# Patient Record
Sex: Female | Born: 1935 | Race: White | Hispanic: No | Marital: Single | Smoking: Current every day smoker
Health system: Southern US, Community
[De-identification: ages and names within clinical notes are randomized; demographics above are authoritative.]

## PROBLEM LIST (undated history)

## (undated) DIAGNOSIS — F329 Major depressive disorder, single episode, unspecified: Secondary | ICD-10-CM

## (undated) DIAGNOSIS — F319 Bipolar disorder, unspecified: Secondary | ICD-10-CM

## (undated) DIAGNOSIS — C801 Malignant (primary) neoplasm, unspecified: Secondary | ICD-10-CM

## (undated) DIAGNOSIS — F32A Depression, unspecified: Secondary | ICD-10-CM

## (undated) HISTORY — PX: MASTECTOMY: SHX3

## (undated) HISTORY — PX: JOINT REPLACEMENT: SHX530

---

## 2014-03-26 ENCOUNTER — Encounter (HOSPITAL_COMMUNITY): Payer: Self-pay | Admitting: Emergency Medicine

## 2014-03-26 ENCOUNTER — Observation Stay (HOSPITAL_COMMUNITY)
Admission: EM | Admit: 2014-03-26 | Discharge: 2014-03-29 | Disposition: A | Discharge status: Home / Self Care | Payer: Medicare Other | Attending: Family Medicine | Admitting: Family Medicine

## 2014-03-26 ENCOUNTER — Emergency Department (HOSPITAL_COMMUNITY): Payer: Medicare Other

## 2014-03-26 DIAGNOSIS — F32A Depression, unspecified: Secondary | ICD-10-CM

## 2014-03-26 DIAGNOSIS — F1010 Alcohol abuse, uncomplicated: Secondary | ICD-10-CM | POA: Insufficient documentation

## 2014-03-26 DIAGNOSIS — Z79899 Other long term (current) drug therapy: Secondary | ICD-10-CM | POA: Diagnosis not present

## 2014-03-26 DIAGNOSIS — F333 Major depressive disorder, recurrent, severe with psychotic symptoms: Secondary | ICD-10-CM | POA: Insufficient documentation

## 2014-03-26 DIAGNOSIS — F0390 Unspecified dementia without behavioral disturbance: Secondary | ICD-10-CM | POA: Insufficient documentation

## 2014-03-26 DIAGNOSIS — F03918 Unspecified dementia, unspecified severity, with other behavioral disturbance: Secondary | ICD-10-CM

## 2014-03-26 DIAGNOSIS — F0391 Unspecified dementia with behavioral disturbance: Secondary | ICD-10-CM

## 2014-03-26 DIAGNOSIS — Z66 Do not resuscitate: Secondary | ICD-10-CM | POA: Diagnosis not present

## 2014-03-26 DIAGNOSIS — F329 Major depressive disorder, single episode, unspecified: Secondary | ICD-10-CM

## 2014-03-26 DIAGNOSIS — Z96659 Presence of unspecified artificial knee joint: Secondary | ICD-10-CM | POA: Insufficient documentation

## 2014-03-26 DIAGNOSIS — R4182 Altered mental status, unspecified: Principal | ICD-10-CM | POA: Insufficient documentation

## 2014-03-26 DIAGNOSIS — Z853 Personal history of malignant neoplasm of breast: Secondary | ICD-10-CM | POA: Insufficient documentation

## 2014-03-26 DIAGNOSIS — F17200 Nicotine dependence, unspecified, uncomplicated: Secondary | ICD-10-CM | POA: Insufficient documentation

## 2014-03-26 DIAGNOSIS — F3289 Other specified depressive episodes: Secondary | ICD-10-CM

## 2014-03-26 HISTORY — DX: Depression, unspecified: F32.A

## 2014-03-26 HISTORY — DX: Malignant (primary) neoplasm, unspecified: C80.1

## 2014-03-26 HISTORY — DX: Bipolar disorder, unspecified: F31.9

## 2014-03-26 HISTORY — DX: Major depressive disorder, single episode, unspecified: F32.9

## 2014-03-26 LAB — I-STAT CHEM 8, ED
BUN: 14 mg/dL (ref 6–23)
CHLORIDE: 101 meq/L (ref 96–112)
Calcium, Ion: 1.17 mmol/L (ref 1.13–1.30)
Creatinine, Ser: 0.8 mg/dL (ref 0.50–1.10)
Glucose, Bld: 121 mg/dL — ABNORMAL HIGH (ref 70–99)
HEMATOCRIT: 52 % — AB (ref 36.0–46.0)
Hemoglobin: 17.7 g/dL — ABNORMAL HIGH (ref 12.0–15.0)
Potassium: 3.4 mEq/L — ABNORMAL LOW (ref 3.7–5.3)
SODIUM: 142 meq/L (ref 137–147)
TCO2: 24 mmol/L (ref 0–100)

## 2014-03-26 LAB — RAPID URINE DRUG SCREEN, HOSP PERFORMED
Amphetamines: NOT DETECTED
Barbiturates: NOT DETECTED
Benzodiazepines: NOT DETECTED
COCAINE: NOT DETECTED
OPIATES: NOT DETECTED
Tetrahydrocannabinol: NOT DETECTED

## 2014-03-26 LAB — URINALYSIS, ROUTINE W REFLEX MICROSCOPIC
Bilirubin Urine: NEGATIVE
Glucose, UA: NEGATIVE mg/dL
Ketones, ur: NEGATIVE mg/dL
Leukocytes, UA: NEGATIVE
NITRITE: NEGATIVE
PH: 7.5 (ref 5.0–8.0)
Protein, ur: NEGATIVE mg/dL
SPECIFIC GRAVITY, URINE: 1.013 (ref 1.005–1.030)
UROBILINOGEN UA: 0.2 mg/dL (ref 0.0–1.0)

## 2014-03-26 LAB — CBC WITH DIFFERENTIAL/PLATELET
BASOS ABS: 0 10*3/uL (ref 0.0–0.1)
Basophils Relative: 0 % (ref 0–1)
Eosinophils Absolute: 0 10*3/uL (ref 0.0–0.7)
Eosinophils Relative: 0 % (ref 0–5)
HCT: 47.4 % — ABNORMAL HIGH (ref 36.0–46.0)
HEMOGLOBIN: 16.2 g/dL — AB (ref 12.0–15.0)
Lymphocytes Relative: 12 % (ref 12–46)
Lymphs Abs: 1.2 10*3/uL (ref 0.7–4.0)
MCH: 33.4 pg (ref 26.0–34.0)
MCHC: 34.2 g/dL (ref 30.0–36.0)
MCV: 97.7 fL (ref 78.0–100.0)
Monocytes Absolute: 0.8 10*3/uL (ref 0.1–1.0)
Monocytes Relative: 8 % (ref 3–12)
NEUTROS ABS: 8.5 10*3/uL — AB (ref 1.7–7.7)
NEUTROS PCT: 80 % — AB (ref 43–77)
PLATELETS: 276 10*3/uL (ref 150–400)
RBC: 4.85 MIL/uL (ref 3.87–5.11)
RDW: 15.1 % (ref 11.5–15.5)
WBC: 10.5 10*3/uL (ref 4.0–10.5)

## 2014-03-26 LAB — URINE MICROSCOPIC-ADD ON

## 2014-03-26 LAB — CREATININE, SERUM
Creatinine, Ser: 0.64 mg/dL (ref 0.50–1.10)
GFR calc Af Amer: >90 mL/min (ref >90)
GFR, EST NON AFRICAN AMERICAN: 85 mL/min — AB (ref >90)

## 2014-03-26 LAB — TSH: TSH: 2.52 u[IU]/mL (ref 0.350–4.500)

## 2014-03-26 LAB — AMMONIA: AMMONIA: 30 umol/L (ref 11–60)

## 2014-03-26 LAB — ETHANOL: Alcohol, Ethyl (B): <11 mg/dL (ref 0–11)

## 2014-03-26 LAB — VITAMIN B12: Vitamin B-12: 325 pg/mL (ref 211–911)

## 2014-03-26 MED ORDER — SODIUM CHLORIDE 0.9 % IV BOLUS (SEPSIS)
1000.0000 mL | Freq: Once | INTRAVENOUS | Status: AC
  Administered 2014-03-26: 1000 mL via INTRAVENOUS

## 2014-03-26 MED ORDER — BUPROPION HCL ER (XL) 150 MG PO TB24
150.0000 mg | ORAL_TABLET | Freq: Every day | ORAL | Status: DC
  Administered 2014-03-26 – 2014-03-27 (×2): 150 mg via ORAL
  Filled 2014-03-26 (×2): qty 1

## 2014-03-26 MED ORDER — ONDANSETRON HCL 4 MG/2ML IJ SOLN: 4.0000 mg | Freq: Four times a day (QID) | INTRAMUSCULAR | Status: DC | PRN

## 2014-03-26 MED ORDER — ACETAMINOPHEN 325 MG PO TABS: 650.0000 mg | ORAL_TABLET | Freq: Four times a day (QID) | ORAL | Status: DC | PRN

## 2014-03-26 MED ORDER — ENOXAPARIN SODIUM 40 MG/0.4ML ~~LOC~~ SOLN
40.0000 mg | SUBCUTANEOUS | Status: DC
  Administered 2014-03-26 – 2014-03-29 (×4): 40 mg via SUBCUTANEOUS
  Filled 2014-03-26 (×4): qty 0.4

## 2014-03-26 MED ORDER — THIAMINE HCL 100 MG/ML IJ SOLN
Freq: Once | INTRAVENOUS | Status: AC
  Administered 2014-03-26: 09:00:00 via INTRAVENOUS
  Filled 2014-03-26: qty 1000

## 2014-03-26 MED ORDER — THIAMINE HCL 100 MG/ML IJ SOLN
Freq: Once | INTRAVENOUS | Status: AC
  Administered 2014-03-26: 10:00:00 via INTRAVENOUS
  Filled 2014-03-26: qty 1000

## 2014-03-26 MED ORDER — DONEPEZIL HCL 5 MG PO TABS
5.0000 mg | ORAL_TABLET | Freq: Every day | ORAL | Status: DC
  Administered 2014-03-26: 5 mg via ORAL
  Filled 2014-03-26 (×2): qty 1

## 2014-03-26 MED ORDER — CIPROFLOXACIN IN D5W 400 MG/200ML IV SOLN
400.0000 mg | Freq: Two times a day (BID) | INTRAVENOUS | Status: DC
  Administered 2014-03-26 – 2014-03-28 (×4): 400 mg via INTRAVENOUS
  Filled 2014-03-26 (×5): qty 200

## 2014-03-26 MED ORDER — LAMOTRIGINE 25 MG PO TABS
25.0000 mg | ORAL_TABLET | Freq: Every day | ORAL | Status: DC
  Administered 2014-03-26 – 2014-03-27 (×2): 25 mg via ORAL
  Filled 2014-03-26 (×2): qty 1

## 2014-03-26 MED ORDER — VENLAFAXINE HCL ER 150 MG PO CP24
150.0000 mg | ORAL_CAPSULE | Freq: Every day | ORAL | Status: DC
  Administered 2014-03-27: 150 mg via ORAL
  Filled 2014-03-26 (×2): qty 1

## 2014-03-26 MED ORDER — ONDANSETRON HCL 4 MG PO TABS: 4.0000 mg | ORAL_TABLET | Freq: Four times a day (QID) | ORAL | Status: DC | PRN

## 2014-03-26 MED ORDER — ACETAMINOPHEN 650 MG RE SUPP: 650.0000 mg | Freq: Four times a day (QID) | RECTAL | Status: DC | PRN

## 2014-03-26 NOTE — ED Provider Notes (Signed)
CSN: [id number]     Arrival date & time 03/26/14  0031 History   First MD Initiated Contact with Patient 03/26/14 0051     Chief Complaint  Patient presents with  . Altered Mental Status     (Consider location/radiation/quality/duration/timing/severity/associated sxs/prior Treatment) HPI Comments: EMS was called to the [facility].  Due to the patient being in the lobby confused, unable to give a concise medical history name, address why she was at a hotel.  In her possession.  She does have her pocketbook with 2 different ideas for her to address this one in [place], [place], and 1, and, states he'll in her pocketbook.  She has several checkbooks 14 her periods and states no 1, for herself in [place].  When asked specifically about her parents.  She states that her father is now in a nursing home, and the mother is in an assisted care facility, and she is here taking care of their affairs.  She does, state that she travels back-and-forth between [place] and states, while, but she is unclear as to why.  She was at the [facility].  She states, that she has a brother, and sister-in-law, who recently moved to [place], and she was attempting to get to their home, but became lost, and stopped at the [place] to try to find or establish her bearings  Patient is a 79 y.o. female presenting with altered mental status. The history is provided by the patient and the EMS personnel. The history is limited by the condition of the patient.  Altered Mental Status Presenting symptoms: confusion and memory loss   Severity:  Severe Episode history:  Unable to specify Timing:  Unable to specify Progression:  Unable to specify Associated symptoms: no fever, no headaches and no nausea     Past Medical History  Diagnosis Date  . lt breast ca dx'd 2007 or 2008 per pt    chemo and xrt  . Depression   . Bipolar disorder    Past Surgical History  Procedure Laterality Date  . Joint replacement       left total knee  . Mastectomy      Left   History reviewed. No pertinent family history. History  Substance Use Topics  . Smoking status: Current Every Day Smoker -- 0.50 packs/day    Types: Cigarettes  . Smokeless tobacco: Never Used  . Alcohol Use: 3.5 oz/week    7 drink(s) per week   OB History   Grav Para Term Preterm Abortions TAB SAB Ect Mult Living                 Review of Systems  Constitutional: Negative for fever.  HENT: Negative for congestion.   Respiratory: Negative for shortness of breath.   Cardiovascular: Negative for chest pain and leg swelling.  Gastrointestinal: Negative for nausea and diarrhea.  Genitourinary: Negative for dysuria.  Neurological: Negative for dizziness and headaches.  Psychiatric/Behavioral: Positive for memory loss and confusion.  All other systems reviewed and are negative.     Allergies  Penicillins  Home Medications   Prior to Admission medications   Medication Sig Start Date End Date Taking? Authorizing Provider  buPROPion (WELLBUTRIN XL) 150 MG 24 hr tablet Take 300 mg by mouth every morning.    Yes Historical Provider, MD  donepezil (ARICEPT) 5 MG tablet Take 5 mg by mouth at bedtime.   Yes Historical Provider, MD  lamoTRIgine (LAMICTAL) 25 MG tablet Take 25 mg by mouth 2 (two)  times daily.    Yes Historical Provider, MD  simvastatin (ZOCOR) 40 MG tablet Take 40 mg by mouth every evening.   Yes Historical Provider, MD  venlafaxine XR (EFFEXOR-XR) 150 MG 24 hr capsule Take 150 mg by mouth daily with breakfast.   Yes Historical Provider, MD   BP 143/80  Pulse 87  Temp(Src) 97.8 F (36.6 C) (Oral)  Resp 18  Ht 5\' 6"  (1.676 m)  Wt 167 lb 12.3 oz (76.1 kg)  BMI 27.09 kg/m2  SpO2 93% Physical Exam  Nursing note and vitals reviewed. Constitutional: She appears well-developed and well-nourished.  HENT:  Head: Normocephalic.  Eyes: Pupils are equal, round, and reactive to light.  Neck: Normal range of motion.   Cardiovascular: Normal rate and regular rhythm.   Pulmonary/Chest: Effort normal and breath sounds normal.  Abdominal: Soft. She exhibits distension. There is no tenderness.  Musculoskeletal: Normal range of motion. She exhibits no tenderness.  Neurological: She is alert. No cranial nerve deficit or sensory deficit.  Psychiatric: Her speech is normal and behavior is normal. Cognition and memory are impaired.  confused    ED Course  Procedures (including critical care time) Labs Review Labs Reviewed  CBC WITH DIFFERENTIAL - Abnormal; Notable for the following:    Hemoglobin 16.2 (*)    HCT 47.4 (*)    Neutrophils Relative % 80 (*)    Neutro Abs 8.5 (*)    All other components within normal limits  URINALYSIS, ROUTINE W REFLEX MICROSCOPIC - Abnormal; Notable for the following:    Hgb urine dipstick SMALL (*)    All other components within normal limits  URINE MICROSCOPIC-ADD ON - Abnormal; Notable for the following:    Squamous Epithelial / LPF FEW (*)    All other components within normal limits  CREATININE, SERUM - Abnormal; Notable for the following:    GFR calc non Af Amer 85 (*)    All other components within normal limits  I-STAT CHEM 8, ED - Abnormal; Notable for the following:    Potassium 3.4 (*)    Glucose, Bld 121 (*)    Hemoglobin 17.7 (*)    HCT 52.0 (*)    All other components within normal limits  URINE CULTURE  ETHANOL  URINE RAPID DRUG SCREEN (HOSP PERFORMED)  AMMONIA  VITAMIN B12  VITAMIN B1  TSH  CBC  COMPREHENSIVE METABOLIC PANEL    Imaging Review Dg Chest 2 View  03/26/2014   CLINICAL DATA:  Altered mental status.  History of breast cancer.  EXAM: CHEST  2 VIEW  COMPARISON:  None.  FINDINGS: Shallow inspiration. Heart size and pulmonary vascularity are normal for technique. No focal airspace disease in the lungs. No blunting of costophrenic angles. Degenerative changes in the spine. Surgical clips in the left axilla. Old fracture deformity of distal  left clavicle.  IMPRESSION: Shallow inspiration.  No evidence of active pulmonary disease.   Electronically Signed   By: Lucienne Capers M.D.   On: 03/26/2014 05:06   Ct Head Wo Contrast  03/26/2014   CLINICAL DATA:  Altered mental status, memory difficulties. History of breast cancer.  EXAM: CT HEAD WITHOUT CONTRAST  TECHNIQUE: Contiguous axial images were obtained from the base of the skull through the vertex without intravenous contrast.  COMPARISON:  MRI of the brain October 25, 2013  FINDINGS: Moderate to severe ventriculomegaly, likely on the basis of global parenchymal brain volume loss as there is commensurate enlargement of cerebral sulci and cerebellar folia, which appears  somewhat advanced from prior imaging. Bifrontal diameter was 4.5 cm, canal 4.7 cm.  No intraparenchymal hemorrhage, mass effect nor midline shift. Patchy supratentorial white matter hypodensities are within normal range for patient's age and though non-specific suggest sequelae of chronic small vessel ischemic disease. No acute large vascular territory infarcts. Remote right basal ganglia lacunar infarct.  No abnormal extra-axial fluid collections. Basal cisterns are patent. Mild calcific atherosclerosis of the carotid siphons.  No skull fracture. Osteopenia. The included ocular globes and orbital contents are non-suspicious. The mastoid aircells and included paranasal sinuses are well-aerated. Thickened maxillary sinus walls compatible with chronic sinusitis without acute component.  IMPRESSION: Moderate to severe apparent global parenchymal brain volume loss, considering progression from prior MRI could reflect a component of treatment related changes are dehydration, recommend clinical correlation.  Moderate white matter changes suggest chronic small vessel ischemic disease. Remote right basal ganglia lacunar infarct.  Osteopenia.   Electronically Signed   By: Elon Alas   On: 03/26/2014 02:14     EKG  Interpretation None      MDM  Tried to contact family with numbers  In phone and pocketbook  Final diagnoses:  Altered mental status, unspecified altered mental status type         Garald Balding, NP 03/26/14 2025

## 2014-03-26 NOTE — ED Notes (Signed)
In and out attempted by two NTs, no urine return. Gave pt water, will re-attempt.

## 2014-03-26 NOTE — Consult Note (Signed)
Jordan Floyd Face-to-Face Psychiatry Consult   Reason for Consult:  AMS and history of depression Referring Physician:  Dr. Lelon Mast Krisher is an 79 y.o. female. Total Time spent with patient: 45 minutes  Assessment: AXIS I:  Major Depression, Recurrent severe AXIS II:  Deferred AXIS III:   Past Medical History  Diagnosis Date  . lt breast ca dx'd 2007 or 2008 per pt    chemo and xrt  . Depression   . Bipolar disorder    AXIS IV:  other psychosocial or environmental problems, problems related to social environment and problems with primary support group AXIS V:  31-40 impairment in reality testing  Plan: Patient has capacity to make her own medical decisions based on evaluation today Case discussed with Dr. Wendee Beavers  Increase Wellbutrin XL 300 mg Qam for depression Discontinue Effexor - not helpful Increase Aricept 10 mg PO Qhs Appreciate dementia work up No evidence of imminent risk to self or others at present.   Patient does not meet criteria for psychiatric inpatient admission. Supportive therapy provided about ongoing stressors. Discussed crisis plan, support from social network, calling 911, coming to the Emergency Department, and calling Suicide Hotline. Appreciate psychiatric consultation Please contact [phone] if needs further assistance  Subjective:   Jordan Floyd is a 79 y.o. female patient admitted with AMS.  HPI: Patient is seen, chart reviewed and case discussed with Sindy Messing, LCSW and Dr. Wendee Beavers. Patient stated that she has been living in [place], [place], came to [place] several months ago to stay near her mother who lives in [place] and her brother lives in [place]. 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 She reportedly visiting her brother and than lost her car in [place] parking lot and  called police to locate her car. She couold not explain how she end up parking her care at hotel. She reportedly struggling with memory loss and forgetful and lost herself while going to different places and asking for directions. Patient is pleasantly confused at this time, says that her mother and brother live in town, and she was visiting them.  Patient lives by herself, she is no family, her brother and mother died many years ago. Patient has history of wandering, and 6 weeks ago she wondered to [employer]. Patient has been seen by psychiatrist Dr. Elon Jester in [place], [place] for depression. She is also on medication Aricept for dementia. Patient has answers most of the questions without much difficult and wishes to be with her mother who was 45 years and does not want to be in assisted living at this time. She stated that she has property in [place], [place] and wants to contact agent and friends to sell the property because she is planning to spend rest of her life in [place], [place]    HPI Elements:  Location:  dementia and depression. Quality:  fair. Severity:  moderate. Timing:  lost orientation from time to time.  Past Psychiatric History: Past Medical History  Diagnosis Date  . lt breast ca dx'd 2007 or 2008 per pt    chemo and xrt  . Depression   . Bipolar disorder     reports that she has been smoking Cigarettes.  She has been smoking about 0.50 packs per day. She has never used smokeless tobacco. She reports that she drinks about 3.5 ounces  of alcohol per week. She reports that she does not use illicit drugs. History reviewed. No pertinent family history.   Living Arrangements: Alone   Abuse/Neglect [facility]) Physical Abuse: Denies Verbal Abuse: Denies Sexual Abuse: Denies Allergies:   Allergies  Allergen Reactions  . Penicillins Other (See Comments)    Childhood allergy    ACT Assessment Complete:  NO Objective: Blood pressure 162/93, pulse 77, temperature 97.5 F (36.4 C),  temperature source Oral, resp. rate 16, height _0  (1.676 m), weight 76.1 kg (167 lb 12.3 oz), SpO2 93.00%.Body mass index is 27.09 kg/(m^2). Results for orders placed during the hospital encounter of 03/26/14 (from the past 72 hour(s))  CBC WITH DIFFERENTIAL     Status: Abnormal   Collection Time    03/26/14  1:41 AM      Result Value Ref Range   WBC 10.5  4.0 - 10.5 K/uL   RBC 4.85  3.87 - 5.11 MIL/uL   Hemoglobin 16.2 (*) 12.0 - 15.0 g/dL   HCT 47.4 (*) 36.0 - 46.0 %   MCV 97.7  78.0 - 100.0 fL   MCH 33.4  26.0 - 34.0 pg   MCHC 34.2  30.0 - 36.0 g/dL   RDW 15.1  11.5 - 15.5 %   Platelets 276  150 - 400 K/uL   Neutrophils Relative % 80 (*) 43 - 77 %   Neutro Abs 8.5 (*) 1.7 - 7.7 K/uL   Lymphocytes Relative 12  12 - 46 %   Lymphs Abs 1.2  0.7 - 4.0 K/uL   Monocytes Relative 8  3 - 12 %   Monocytes Absolute 0.8  0.1 - 1.0 K/uL   Eosinophils Relative 0  0 - 5 %   Eosinophils Absolute 0.0  0.0 - 0.7 K/uL   Basophils Relative 0  0 - 1 %   Basophils Absolute 0.0  0.0 - 0.1 K/uL  ETHANOL     Status: None   Collection Time    03/26/14  1:41 AM      Result Value Ref Range   Alcohol, Ethyl (B) <11  0 - 11 mg/dL   Comment:            LOWEST DETECTABLE LIMIT FOR     SERUM ALCOHOL IS 11 mg/dL     FOR MEDICAL PURPOSES ONLY  I-STAT CHEM 8, ED     Status: Abnormal   Collection Time    03/26/14  1:52 AM      Result Value Ref Range   Sodium 142  137 - 147 mEq/L   Potassium 3.4 (*) 3.7 - 5.3 mEq/L   Chloride 101  96 - 112 mEq/L   BUN 14  6 - 23 mg/dL   Creatinine, Ser 0.80  0.50 - 1.10 mg/dL   Glucose, Bld 121 (*) 70 - 99 mg/dL   Calcium, Ion 1.17  1.13 - 1.30 mmol/L   TCO2 24  0 - 100 mmol/L   Hemoglobin 17.7 (*) 12.0 - 15.0 g/dL   HCT 52.0 (*) 36.0 - 46.0 %  URINALYSIS, ROUTINE W REFLEX MICROSCOPIC     Status: Abnormal   Collection Time    03/26/14  5:42 AM      Result Value Ref Range   Color, Urine YELLOW  YELLOW   APPearance CLEAR  CLEAR   Specific Gravity, Urine 1.013   1.005 - 1.030   pH 7.5  5.0 - 8.0   Glucose, UA NEGATIVE  NEGATIVE mg/dL   Hgb  urine dipstick SMALL (*) NEGATIVE   Bilirubin Urine NEGATIVE  NEGATIVE   Ketones, ur NEGATIVE  NEGATIVE mg/dL   Protein, ur NEGATIVE  NEGATIVE mg/dL   Urobilinogen, UA 0.2  0.0 - 1.0 mg/dL   Nitrite NEGATIVE  NEGATIVE   Leukocytes, UA NEGATIVE  NEGATIVE  URINE RAPID DRUG SCREEN (HOSP PERFORMED)     Status: None   Collection Time    03/26/14  5:42 AM      Result Value Ref Range   Opiates NONE DETECTED  NONE DETECTED   Cocaine NONE DETECTED  NONE DETECTED   Benzodiazepines NONE DETECTED  NONE DETECTED   Amphetamines NONE DETECTED  NONE DETECTED   Tetrahydrocannabinol NONE DETECTED  NONE DETECTED   Barbiturates NONE DETECTED  NONE DETECTED   Comment:            DRUG SCREEN FOR MEDICAL PURPOSES     ONLY.  IF CONFIRMATION IS NEEDED     FOR ANY PURPOSE, NOTIFY LAB     WITHIN 5 DAYS.                LOWEST DETECTABLE LIMITS     FOR URINE DRUG SCREEN     Drug Class       Cutoff (ng/mL)     Amphetamine      1000     Barbiturate      200     Benzodiazepine   364     Tricyclics       680     Opiates          300     Cocaine          300     THC              50  URINE MICROSCOPIC-ADD ON     Status: Abnormal   Collection Time    03/26/14  5:42 AM      Result Value Ref Range   Squamous Epithelial / LPF FEW (*) RARE   WBC, UA 0-2  <3 WBC/hpf   RBC / HPF 3-6  <3 RBC/hpf   Bacteria, UA RARE  RARE   Urine-Other AMORPHOUS URATES/PHOSPHATES    AMMONIA     Status: None   Collection Time    03/26/14  7:10 AM      Result Value Ref Range   Ammonia 30  11 - 60 umol/L  CREATININE, SERUM     Status: Abnormal   Collection Time    03/26/14  7:10 AM      Result Value Ref Range   Creatinine, Ser 0.64  0.50 - 1.10 mg/dL   GFR calc non Af Amer 85 (*) >90 mL/min   GFR calc Af Amer >90  >90 mL/min   Comment: (NOTE)     The eGFR has been calculated using the CKD EPI equation.     This calculation has not been  validated in all clinical situations.     eGFR's persistently <90 mL/min signify possible Chronic Kidney     Disease.   Labs are reviewed and are pertinent for WNL.  Current Facility-Administered Medications  Medication Dose Route Frequency Provider Last Rate Last Dose  . acetaminophen (TYLENOL) tablet 650 mg  650 mg Oral Q6H PRN Oswald Hillock, MD       Or  . acetaminophen (TYLENOL) suppository 650 mg  650 mg Rectal Q6H PRN Oswald Hillock, MD      . buPROPion (WELLBUTRIN XL) 24  hr tablet 150 mg  150 mg Oral Daily Oswald Hillock, MD      . donepezil (ARICEPT) tablet 5 mg  5 mg Oral QHS Oswald Hillock, MD      . enoxaparin (LOVENOX) injection 40 mg  40 mg Subcutaneous Q24H Oswald Hillock, MD      . lamoTRIgine (LAMICTAL) tablet 25 mg  25 mg Oral Daily Oswald Hillock, MD      . ondansetron (ZOFRAN) tablet 4 mg  4 mg Oral Q6H PRN Oswald Hillock, MD       Or  . ondansetron (ZOFRAN) injection 4 mg  4 mg Intravenous Q6H PRN Oswald Hillock, MD      . sodium chloride 0.9 % 1,000 mL with thiamine 623 mg, folic acid 1 mg, multivitamins adult 10 mL infusion   Intravenous Once Oswald Hillock, MD      . Derrill Memo ON 03/27/2014] venlafaxine XR (EFFEXOR-XR) 24 hr capsule 150 mg  150 mg Oral Q breakfast Oswald Hillock, MD        Psychiatric Specialty Exam: Physical Exam  ROS  Blood pressure 162/93, pulse 77, temperature 97.5 F (36.4 C), temperature source Oral, resp. rate 16, height _0  (1.676 m), weight 76.1 kg (167 lb 12.3 oz), SpO2 93.00%.Body mass index is 27.09 kg/(m^2).  General Appearance: Casual  Eye Contact::  Good  Speech:  Clear and Coherent  Volume:  Normal  Mood:  Anxious and Depressed  Affect:  Constricted and Depressed  Thought Process:  Coherent and Goal Directed  Orientation:  Full (Time, Place, and Person)  Thought Content:  WDL  Suicidal Thoughts:  No  Homicidal Thoughts:  No  Memory:  Immediate;   Good  Judgement:  Intact  Insight:  Fair  Psychomotor Activity:  Decreased  Concentration:   Fair  Recall:  Good  Fund of Knowledge:Good  Language: Good  Akathisia:  NA  Handed:  Right  AIMS (if indicated):     Assets:  Communication Skills Desire for Improvement Financial Resources/Insurance Housing Leisure Time [place] Talents/Skills Transportation  Sleep:      Musculoskeletal: Strength & Muscle Tone: within normal limits Gait & Station: unable to stand Patient leans: N/A  Treatment Plan Summary: Daily contact with patient to assess and evaluate symptoms and progress in treatment Medication management  JONNALAGADDA,JANARDHAHA R. 03/26/2014 1:57 PM

## 2014-03-26 NOTE — ED Provider Notes (Signed)
Medical screening examination/treatment/procedure(s) were performed by non-physician practitioner and as supervising physician I was immediately available for consultation/collaboration.   EKG Interpretation None       Laith Antonelli K Swannie Milius-Rasch, MD 03/26/14 2334

## 2014-03-26 NOTE — ED Notes (Signed)
Officer Counts comes to ED with information that patient was found on May 22nd in [place], same situation as tonight. At that time, Sherrie George, a friend came and got her then. [phone]. This officer states that her husband and son are both deceased.

## 2014-03-26 NOTE — ED Notes (Signed)
Per PTAR report: pt was found sitting [employer].  Pt is ambulatory but is unable to tell anyone where she lives or where family is.  Pt alert and oriented to herself. PTAR VS: BP: 145/P, HR: 99,  RR: 18, 92% RA, CBG: 186

## 2014-03-26 NOTE — Progress Notes (Signed)
Patient found in bed raised at highest position without bed alarm initiated.  Oriented patient to room, ordering meals, call light, patient states she came to [facility] via taxi to visit her mother and her brother whom her neighbors/POAs state were no longer living.  Instructed patient that she needed to call for assistance out of bed, verbalized understanding.

## 2014-03-26 NOTE — Progress Notes (Signed)
Patient continues to have bloody urine in toliet and on bed pad with incontinence.  She verbalized she is not experiencing pain, burning, spasms, etc and did not have any blood until after the catherization was done in the ED.

## 2014-03-26 NOTE — H&P (Signed)
Primary care physician Alice Rieger in [place] [phone]  Chief Complaint:  Altered mental status  HPI: 79 year old female who   has a past medical history of lt breast ca (dx'd 2007 or 2008 per pt)., depression, ? Early dementia who was found to the parking lot of [place] hotel, as she was confused social brought to the hospital. Patient lives in [place], which is one hour away from [place]. Patient is pleasantly confused at this time, says that her mother and brother live in town, and she was visiting them. She is alert oriented x2, denies any complaints. Denies chest pain shortness of breath, denies passing out, no dizziness no fever no dysuria. Patient lives by herself, her neighbors Jenny Reichmann and Reino Bellis phone [phone], are her healthcare power of attorney. Called the neighbors, and they told that patient lives by herself, she is no family, her brother and mother died many years ago. Patient has history of wandering, and 6 weeks ago she wondered to [employer]. Patient has been seen by psychiatrist for depression. She is also on medication for dementia. In the ED CT head was done which did not show any acute abnormality. Showed chronic changes.  *  Allergies:   Allergies  Allergen Reactions  . Penicillins Other (See Comments)    Childhood allergy      Past Medical History  Diagnosis Date  . lt breast ca dx'd 2007 or 2008 per pt    chemo and xrt    History reviewed. No pertinent past surgical history.  Prior to Admission medications   Medication Sig Start Date End Date Taking? Authorizing Provider  buPROPion (WELLBUTRIN XL) 150 MG 24 hr tablet Take 150 mg by mouth daily.   Yes Historical Provider, MD  donepezil (ARICEPT) 5 MG tablet Take 5 mg by mouth at bedtime.   Yes Historical Provider, MD  lamoTRIgine (LAMICTAL) 25 MG tablet Take 25 mg by mouth daily.   Yes Historical Provider, MD  venlafaxine XR (EFFEXOR-XR) 150 MG 24 hr capsule Take 150 mg by mouth  daily with breakfast.   Yes Historical Provider, MD    Social History:  reports that she has been smoking Cigarettes.  She has been smoking about 0.50 packs per day. She does not have any smokeless tobacco history on file. She reports that she drinks about 3.5 ounces of alcohol per week. She reports that she does not use illicit drugs.  No family history on file.   All the positives are listed in BOLD  Review of Systems:  HEENT: Headache, blurred vision, runny nose, sore throat Neck: Hypothyroidism, hyperthyroidism,,lymphadenopathy Chest : Shortness of breath, history of COPD, Asthma Heart : Chest pain, history of coronary arterey disease GI:  Nausea, vomiting, diarrhea, constipation, GERD GU: Dysuria, urgency, frequency of urination, hematuria Neuro: Stroke, seizures, syncope Psych: Depression, anxiety, hallucinations   Physical Exam: Blood pressure 160/95, pulse 86, temperature 97.7 F (36.5 C), temperature source Oral, resp. rate 16, SpO2 93.00%. Constitutional:   Patient is a well-developed and well-nourished female* in no acute distress and cooperative with exam. Head: Normocephalic and atraumatic Mouth: Mucus membranes moist Eyes: PERRL, EOMI, conjunctivae normal Neck: Supple, No Thyromegaly Cardiovascular: RRR, S1 normal, S2 normal Pulmonary/Chest: CTAB, no wheezes, rales, or rhonchi Abdominal: Soft. Non-tender, non-distended, bowel sounds are normal, no masses, organomegaly, or guarding present.  Neurological: A&O x2, Strenght is normal and symmetric bilaterally, cranial nerve II-XII are grossly intact, no focal motor deficit, sensory intact to light touch bilaterally.  Extremities : No Cyanosis, Clubbing  or Edema  Labs on Admission:  Basic Metabolic Panel:  Recent Labs Lab 03/26/14 0152  NA 142  K 3.4*  CL 101  GLUCOSE 121*  BUN 14  CREATININE 0.80   Liver Function Tests: No results found for this basename: AST, ALT, ALKPHOS, BILITOT, PROT, ALBUMIN,  in the  last 168 hours No results found for this basename: LIPASE, AMYLASE,  in the last 168 hours  Recent Labs Lab 03/26/14 0710  AMMONIA 30   CBC:  Recent Labs Lab 03/26/14 0141 03/26/14 0152  WBC 10.5  --   NEUTROABS 8.5*  --   HGB 16.2* 17.7*  HCT 47.4* 52.0*  MCV 97.7  --   PLT 276  --    Cardiac Enzymes: No results found for this basename: CKTOTAL, CKMB, CKMBINDEX, TROPONINI,  in the last 168 hours  BNP (last 3 results) No results found for this basename: PROBNP,  in the last 8760 hours CBG: No results found for this basename: GLUCAP,  in the last 168 hours  Radiological Exams on Admission: Dg Chest 2 View  03/26/2014   CLINICAL DATA:  Altered mental status.  History of breast cancer.  EXAM: CHEST  2 VIEW  COMPARISON:  None.  FINDINGS: Shallow inspiration. Heart size and pulmonary vascularity are normal for technique. No focal airspace disease in the lungs. No blunting of costophrenic angles. Degenerative changes in the spine. Surgical clips in the left axilla. Old fracture deformity of distal left clavicle.  IMPRESSION: Shallow inspiration.  No evidence of active pulmonary disease.   Electronically Signed   By: Lucienne Capers M.D.   On: 03/26/2014 05:06   Ct Head Wo Contrast  03/26/2014   CLINICAL DATA:  Altered mental status, memory difficulties. History of breast cancer.  EXAM: CT HEAD WITHOUT CONTRAST  TECHNIQUE: Contiguous axial images were obtained from the base of the skull through the vertex without intravenous contrast.  COMPARISON:  MRI of the brain October 25, 2013  FINDINGS: Moderate to severe ventriculomegaly, likely on the basis of global parenchymal brain volume loss as there is commensurate enlargement of cerebral sulci and cerebellar folia, which appears somewhat advanced from prior imaging. Bifrontal diameter was 4.5 cm, canal 4.7 cm.  No intraparenchymal hemorrhage, mass effect nor midline shift. Patchy supratentorial white matter hypodensities are within normal  range for patient's age and though non-specific suggest sequelae of chronic small vessel ischemic disease. No acute large vascular territory infarcts. Remote right basal ganglia lacunar infarct.  No abnormal extra-axial fluid collections. Basal cisterns are patent. Mild calcific atherosclerosis of the carotid siphons.  No skull fracture. Osteopenia. The included ocular globes and orbital contents are non-suspicious. The mastoid aircells and included paranasal sinuses are well-aerated. Thickened maxillary sinus walls compatible with chronic sinusitis without acute component.  IMPRESSION: Moderate to severe apparent global parenchymal brain volume loss, considering progression from prior MRI could reflect a component of treatment related changes are dehydration, recommend clinical correlation.  Moderate white matter changes suggest chronic small vessel ischemic disease. Remote right basal ganglia lacunar infarct.  Osteopenia.   Electronically Signed   By: Elon Alas   On: 03/26/2014 02:14    Assessment/Plan Active Problems:   Altered mental status   Depression  Altered mental status Patient is pleasantly confused at this time, likely early dementia. No source of infection found, UA is clear, chest x-ray shows no evidence of pulmonary disease. CT head showed no acute abnormality. Will admit the patient for further evaluation. Will check TSH, B12 level.  Alcohol abuse As per patient's healthcare power of attorney, she drinks alcohol everyday. Patient says that she takes 1 drink every day, the alcohol level was less than 11. Will start patient on thiamine and folate, will also start CI WA protocol in case she was into alcohol withdrawal.  Dementia Patient has been on Aricept, probable early dementia. She is not displaying any behavior disturbance at this time.  Depression Patient is on multiple antidepressants in including Wellbutrin and Effexor along with Lamictal. We'll get psychiatric  consultation to adjust medications.  DVT prophylaxis Lovenox  Code status: Patient is DO NOT RESUSCITATE  Family discussion: Admission, patients condition and plan of care including tests being ordered have been discussed with the patient and healthcare power of attorney, Jenny Reichmann and Reino Bellis phone [phone] who indicate understanding and agree with the plan and Code Status.   Time Spent on Admission: 70 minutes  [facility] Pager: [phone] 03/26/2014, 8:10 AM  If 7PM-7AM, please contact night-coverage  [email or web address]  Password [email or web address]

## 2014-03-26 NOTE — Progress Notes (Signed)
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  She does have a Chartered certified accountant come in to the home because she is unable to care for self or home.  Ms. Jordan Floyd states she has found the patient lying on floor, soiled from incontinence of bladder and bowel.  They have been in contact with [facility] in [place], [place] working on seeking placement.  Ms Jordan Floyd will fax the POA papers tomorrow morning for our files.

## 2014-03-26 NOTE — ED Notes (Signed)
Pt at CT

## 2014-03-26 NOTE — ED Notes (Signed)
Bed: IF12 Expected date: 03/26/14 Expected time: 12:25 AM Means of arrival: Ambulance Comments: Confused

## 2014-03-26 NOTE — ED Notes (Signed)
Attempted to call report. Nurse in huddle. Will attempt again in 85min.

## 2014-03-26 NOTE — ED Notes (Addendum)
Attempted in and out cath no urine return....notified nurse

## 2014-03-26 NOTE — Progress Notes (Addendum)
Patient assisted to bathroom, urine has foul smell with blood tinged urine, patient states that she thinks the blood is coming from when they attempted to catherize her in the emergency room.   Notified Dr, Darrick Meigs of above

## 2014-03-27 DIAGNOSIS — F332 Major depressive disorder, recurrent severe without psychotic features: Secondary | ICD-10-CM

## 2014-03-27 DIAGNOSIS — R4182 Altered mental status, unspecified: Secondary | ICD-10-CM | POA: Diagnosis not present

## 2014-03-27 LAB — COMPREHENSIVE METABOLIC PANEL
ALT: 7 U/L (ref 0–35)
AST: 14 U/L (ref 0–37)
Albumin: 3.3 g/dL — ABNORMAL LOW (ref 3.5–5.2)
Alkaline Phosphatase: 107 U/L (ref 39–117)
BUN: 7 mg/dL (ref 6–23)
CO2: 28 mEq/L (ref 19–32)
CREATININE: 0.67 mg/dL (ref 0.50–1.10)
Calcium: 9.1 mg/dL (ref 8.4–10.5)
Chloride: 106 mEq/L (ref 96–112)
GFR calc non Af Amer: 83 mL/min — ABNORMAL LOW (ref >90)
Glucose, Bld: 112 mg/dL — ABNORMAL HIGH (ref 70–99)
Potassium: 3.5 mEq/L — ABNORMAL LOW (ref 3.7–5.3)
SODIUM: 147 meq/L (ref 137–147)
TOTAL PROTEIN: 6.2 g/dL (ref 6.0–8.3)
Total Bilirubin: 0.5 mg/dL (ref 0.3–1.2)

## 2014-03-27 LAB — URINE CULTURE

## 2014-03-27 LAB — FOLATE: FOLATE: 11 ng/mL

## 2014-03-27 LAB — CBC
HCT: 46.6 % — ABNORMAL HIGH (ref 36.0–46.0)
HEMOGLOBIN: 15.5 g/dL — AB (ref 12.0–15.0)
MCH: 33.2 pg (ref 26.0–34.0)
MCHC: 33.3 g/dL (ref 30.0–36.0)
MCV: 99.8 fL (ref 78.0–100.0)
Platelets: 225 10*3/uL (ref 150–400)
RBC: 4.67 MIL/uL (ref 3.87–5.11)
RDW: 15.2 % (ref 11.5–15.5)
WBC: 6.5 10*3/uL (ref 4.0–10.5)

## 2014-03-27 LAB — HIV ANTIBODY (ROUTINE TESTING W REFLEX): HIV: NONREACTIVE

## 2014-03-27 LAB — RPR

## 2014-03-27 MED ORDER — BUPROPION HCL ER (XL) 300 MG PO TB24
300.0000 mg | ORAL_TABLET | Freq: Every day | ORAL | Status: DC
  Administered 2014-03-28 – 2014-03-29 (×2): 300 mg via ORAL
  Filled 2014-03-27 (×2): qty 1

## 2014-03-27 MED ORDER — LAMOTRIGINE 25 MG PO TABS
25.0000 mg | ORAL_TABLET | Freq: Two times a day (BID) | ORAL | Status: DC
  Administered 2014-03-27 – 2014-03-29 (×4): 25 mg via ORAL
  Filled 2014-03-27 (×5): qty 1

## 2014-03-27 MED ORDER — DONEPEZIL HCL 10 MG PO TABS
10.0000 mg | ORAL_TABLET | Freq: Every day | ORAL | Status: DC
  Administered 2014-03-27 – 2014-03-28 (×2): 10 mg via ORAL
  Filled 2014-03-27 (×3): qty 1

## 2014-03-27 NOTE — Progress Notes (Signed)
[facility] PROGRESS NOTE  Jordan Floyd BHA:[id number] DOB: 12/22/1934 DOA: 03/26/2014 PCP: No primary provider on file.  Assessment/Plan:  Active Problems:   Altered mental status - Psychiatry on board and evaluating patient recommendations pending - Will assess folate, RPR, HIV - Vitamin b12, TSH, ammonia within normal limits.    Depression - Per psychiatry  History of alcohol abuse per EMR - [place] protocol   Code Status: DNR Family Communication: No family at bedside, Jenny Reichmann and Reino Bellis phone [phone], are her healthcare power of attorney Disposition Plan: Pending further work up and recommendations from psychiatrist   Consultants:  Psychiatrist  Procedures:  none   HPI/Subjective: No new complaints. No acute issues reported overnight.  Objective: Filed Vitals:   03/27/14 0820  BP: 148/90  Pulse: 82  Temp: 97.8 F (36.6 C)  Resp: 18    Intake/Output Summary (Last 24 hours) at 03/27/14 1248 Last data filed at 03/27/14 1246  Gross per 24 hour  Intake    680 ml  Output   1450 ml  Net   -770 ml   Filed Weights   03/26/14 1000  Weight: 76.1 kg (167 lb 12.3 oz)    Exam:   General:  Pt in NAD, alert and Oriented x 2 to person and place but not time.  Cardiovascular: RRR, no MRG  Respiratory: CTA BL, no wheezes  Abdomen: soft, NT, ND  Musculoskeletal: no cyanosis or clubbing   Data Reviewed: Basic Metabolic Panel:  Recent Labs Lab 03/26/14 0152 03/26/14 0710 03/27/14 0500  NA 142  --  147  K 3.4*  --  3.5*  CL 101  --  106  CO2  --   --  28  GLUCOSE 121*  --  112*  BUN 14  --  7  CREATININE 0.80 0.64 0.67  CALCIUM  --   --  9.1   Liver Function Tests:  Recent Labs Lab 03/27/14 0500  AST 14  ALT 7  ALKPHOS 107  BILITOT 0.5  PROT 6.2  ALBUMIN 3.3*   No results found for this basename: LIPASE, AMYLASE,  in the last 168 hours  Recent Labs Lab 03/26/14 0710  AMMONIA 30   CBC:  Recent Labs Lab 03/26/14 0141  03/26/14 0152 03/27/14 0500  WBC 10.5  --  6.5  NEUTROABS 8.5*  --   --   HGB 16.2* 17.7* 15.5*  HCT 47.4* 52.0* 46.6*  MCV 97.7  --  99.8  PLT 276  --  225   Cardiac Enzymes: No results found for this basename: CKTOTAL, CKMB, CKMBINDEX, TROPONINI,  in the last 168 hours BNP (last 3 results) No results found for this basename: PROBNP,  in the last 8760 hours CBG: No results found for this basename: GLUCAP,  in the last 168 hours  No results found for this or any previous visit (from the past 240 hour(s)).   Studies: Dg Chest 2 View  03/26/2014   CLINICAL DATA:  Altered mental status.  History of breast cancer.  EXAM: CHEST  2 VIEW  COMPARISON:  None.  FINDINGS: Shallow inspiration. Heart size and pulmonary vascularity are normal for technique. No focal airspace disease in the lungs. No blunting of costophrenic angles. Degenerative changes in the spine. Surgical clips in the left axilla. Old fracture deformity of distal left clavicle.  IMPRESSION: Shallow inspiration.  No evidence of active pulmonary disease.   Electronically Signed   By: Lucienne Capers M.D.   On: 03/26/2014 05:06   Ct Head  Wo Contrast  03/26/2014   CLINICAL DATA:  Altered mental status, memory difficulties. History of breast cancer.  EXAM: CT HEAD WITHOUT CONTRAST  TECHNIQUE: Contiguous axial images were obtained from the base of the skull through the vertex without intravenous contrast.  COMPARISON:  MRI of the brain October 25, 2013  FINDINGS: Moderate to severe ventriculomegaly, likely on the basis of global parenchymal brain volume loss as there is commensurate enlargement of cerebral sulci and cerebellar folia, which appears somewhat advanced from prior imaging. Bifrontal diameter was 4.5 cm, canal 4.7 cm.  No intraparenchymal hemorrhage, mass effect nor midline shift. Patchy supratentorial white matter hypodensities are within normal range for patient's age and though non-specific suggest sequelae of chronic small  vessel ischemic disease. No acute large vascular territory infarcts. Remote right basal ganglia lacunar infarct.  No abnormal extra-axial fluid collections. Basal cisterns are patent. Mild calcific atherosclerosis of the carotid siphons.  No skull fracture. Osteopenia. The included ocular globes and orbital contents are non-suspicious. The mastoid aircells and included paranasal sinuses are well-aerated. Thickened maxillary sinus walls compatible with chronic sinusitis without acute component.  IMPRESSION: Moderate to severe apparent global parenchymal brain volume loss, considering progression from prior MRI could reflect a component of treatment related changes are dehydration, recommend clinical correlation.  Moderate white matter changes suggest chronic small vessel ischemic disease. Remote right basal ganglia lacunar infarct.  Osteopenia.   Electronically Signed   By: Elon Alas   On: 03/26/2014 02:14    Scheduled Meds: . buPROPion  150 mg Oral Daily  . ciprofloxacin  400 mg Intravenous Q12H  . donepezil  5 mg Oral QHS  . enoxaparin (LOVENOX) injection  40 mg Subcutaneous Q24H  . lamoTRIgine  25 mg Oral BID  . venlafaxine XR  150 mg Oral Q breakfast   Continuous Infusions:    Time spent: > 35 minutes    Velvet Bathe  [facility] Pager [phone]. If 7PM-7AM, please contact night-coverage at [email or web address], password [facility] 03/27/2014, 12:48 PM  LOS: 1 day
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
## 2014-03-27 NOTE — Progress Notes (Signed)
Utilization review completed.  

## 2014-03-28 DIAGNOSIS — R4182 Altered mental status, unspecified: Secondary | ICD-10-CM | POA: Diagnosis not present

## 2014-03-28 MED ORDER — TUBERCULIN PPD 5 UNIT/0.1ML ID SOLN
5.0000 [IU] | Freq: Once | INTRADERMAL | Status: DC
  Administered 2014-03-28: 5 [IU] via INTRADERMAL
  Filled 2014-03-28: qty 0.1

## 2014-03-28 NOTE — Progress Notes (Signed)
Clinical Social Work Department CLINICAL SOCIAL WORK PLACEMENT NOTE 03/28/2014  Patient:  Jordan Floyd, Jordan Floyd  Account Number:  [id number] Admit date:  03/26/2014  Clinical Social Worker:  Sindy Messing, LCSW  Date/time:  03/28/2014 11:30 AM  Clinical Social Work is seeking post-discharge placement for this patient at the following level of care:   ASSISTED LIVING/REST HOME   (*CSW will update this form in Epic as items are completed)    HCPOA Declined Patient/family provided with [place] Department of Clinical Social Work's list of facilities offering this level of care within the geographic area requested by the patient (or if unable, by the patient's family).   03/28/14 Patient/family informed of their freedom to choose among providers that offer the needed level of care, that participate in Medicare, Medicaid or managed care program needed by the patient, have an available bed and are willing to accept the patient.   03/28/14 Patient/family informed of [facility]' ownership interest in [facility], as well as of the fact that they are under no obligation to receive care at this facility.  PASARR submitted to EDS on ALF reports PASRR not needed since private pay PASARR number received on   FL2 transmitted to all facilities in geographic area requested by pt/family on  03/28/2014 FL2 transmitted to all facilities within larger geographic area on   Patient informed that his/her managed care company has contracts with or will negotiate with  certain facilities, including the following:     Patient/family informed of bed offers received:   Patient chooses bed at  Physician recommends and patient chooses bed at    Patient to be transferred to  on   Patient to be transferred to facility by  Patient and family notified of transfer on  Name of family member notified:    The following physician request were entered in Epic:   Additional Comments:

## 2014-03-28 NOTE — Progress Notes (Signed)
[facility] PROGRESS NOTE  Jordan Floyd WUJ:[id number] DOB: Feb 20, 1935 DOA: 03/26/2014 PCP: No primary provider on file.  Assessment/Plan:  Active Problems:   Altered mental status - Psychiatry on board and evaluating patient recommendations pending - Folate, RPR, HIV: WNL - Vitamin b12, TSH, ammonia within normal limits.    Depression - Per psychiatry - Patient currently does not have capacity to make her own medical decisions. As such plan will be to prepare a safe discharge plan.  Social worker on board and currently helping [place] with disposition.  Currently considering ALF with a memory unit.  History of alcohol abuse per EMR - [place] protocol   Code Status: DNR Family Communication: No family at bedside, Jenny Reichmann and Reino Bellis phone [phone], are her healthcare power of attorney Disposition Plan: Pending further work up and recommendations from psychiatrist   Consultants:  Psychiatrist  Procedures:  none   HPI/Subjective: No new complaints. No acute issues reported overnight.  Objective: Filed Vitals:   03/28/14 1418  BP: 144/92  Pulse: 79  Temp: 98.1 F (36.7 C)  Resp: 20    Intake/Output Summary (Last 24 hours) at 03/28/14 1547 Last data filed at 03/28/14 1418  Gross per 24 hour  Intake      0 ml  Output   1050 ml  Net  -1050 ml   Filed Weights   03/26/14 1000  Weight: 76.1 kg (167 lb 12.3 oz)    Exam:   General:  Pt in NAD, alert and Oriented x 2 to person and place but not time.  Cardiovascular: RRR, no MRG  Respiratory: CTA BL, no wheezes  Abdomen: soft, NT, ND  Musculoskeletal: no cyanosis or clubbing   Data Reviewed: Basic Metabolic Panel:  Recent Labs Lab 03/26/14 0152 03/26/14 0710 03/27/14 0500  NA 142  --  147  K 3.4*  --  3.5*  CL 101  --  106  CO2  --   --  28  GLUCOSE 121*  --  112*  BUN 14  --  7  CREATININE 0.80 0.64 0.67  CALCIUM  --   --  9.1   Liver Function Tests:  Recent Labs Lab 03/27/14 0500   AST 14  ALT 7  ALKPHOS 107  BILITOT 0.5  PROT 6.2  ALBUMIN 3.3*   No results found for this basename: LIPASE, AMYLASE,  in the last 168 hours  Recent Labs Lab 03/26/14 0710  AMMONIA 30   CBC:  Recent Labs Lab 03/26/14 0141 03/26/14 0152 03/27/14 0500  WBC 10.5  --  6.5  NEUTROABS 8.5*  --   --   HGB 16.2* 17.7* 15.5*  HCT 47.4* 52.0* 46.6*  MCV 97.7  --  99.8  PLT 276  --  225   Cardiac Enzymes: No results found for this basename: CKTOTAL, CKMB, CKMBINDEX, TROPONINI,  in the last 168 hours BNP (last 3 results) No results found for this basename: PROBNP,  in the last 8760 hours CBG: No results found for this basename: GLUCAP,  in the last 168 hours  Recent Results (from the past 240 hour(s))  URINE CULTURE     Status: None   Collection Time    03/26/14  5:47 PM      Result Value Ref Range Status   Specimen Description URINE, CLEAN CATCH   Final   Special Requests NONE   Final   Culture  Setup Time     Final   Value: 03/26/2014 23:09     Performed at  [facility]     Final   Value: 40,000 COLONIES/ML     Performed at [facility]     Final   Value: Multiple bacterial morphotypes present, none predominant. Suggest appropriate recollection if clinically indicated.     Performed at [facility]   Report Status 03/27/2014 FINAL   Final     Studies: No results found.  Scheduled Meds: . buPROPion  300 mg Oral Daily  . donepezil  10 mg Oral QHS  . enoxaparin (LOVENOX) injection  40 mg Subcutaneous Q24H  . lamoTRIgine  25 mg Oral BID  . tuberculin  5 Units Intradermal Once   Continuous Infusions:    Time spent: > 35 minutes    Velvet Bathe  [facility] Pager [phone]. If 7PM-7AM, please contact night-coverage at [email or web address], password [facility] 03/28/2014, 3:47 PM  LOS: 2 days

## 2014-03-28 NOTE — Consult Note (Signed)
[facility] Face-to-Face Psychiatry Consult   Reason for Consult:  AMS and history of depression Referring Physician:  Dr. Olena Heckle [name] is an 79 y.o. female. Total Time spent with patient: 45 minutes  Assessment: AXIS I:  Major Depression, Recurrent severe AXIS II:  Deferred AXIS III:   Past Medical History  Diagnosis Date  . lt breast ca dx'd 2007 or 2008 per pt    chemo and xrt  . Depression   . Bipolar disorder    AXIS IV:  other psychosocial or environmental problems, problems related to social environment and problems with primary support group AXIS V:  31-40 impairment in reality testing  Plan: Patient has no capacity to make her own medical decisions based on evaluation today Case discussed with Dr. Cena Benton and Unk Lightning, LCSW Continue Wellbutrin XL 300 mg Qam for depression Continue Aricept 10 mg PO Qhs Appreciate dementia work up and recommend SNF with memory unit to prevent wondering and safety No evidence of imminent risk to self or others at present.   Patient does not meet criteria for psychiatric inpatient admission. Supportive therapy provided about ongoing stressors. Discussed crisis plan, support from social network, calling 911, coming to the Emergency Department, and calling Suicide Hotline. Appreciate psychiatric consultation Please contact [phone] if needs further assistance  Subjective:   Jordan Floyd is a 79 y.o. female patient admitted with AMS.  HPI: Patient is seen, chart reviewed and case discussed with Unk Lightning, LCSW and Dr. Cena Benton. Patient stated that she has been living in [place], [place], came to [place] several months ago to stay near her mother who lives in [place] and her brother lives in [place]. 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 She reportedly visiting her  brother and than lost her car in [place] hotel parking lot and called police to locate her car. She couold not explain how she end up parking her care at hotel. She reportedly struggling with memory loss and forgetful and lost herself while going to different places and asking for directions. Patient is pleasantly confused at this time, says that her mother and brother live in town, and she was visiting them. Patient lives by herself, she is no family, her brother and mother died many years ago. Patient has history of wandering, and 6 weeks ago she wondered to [employer]. Patient has been seen by psychiatrist Dr. Sharion Dove in [place], [place] for depression. She is also on medication Aricept for dementia. Patient has answers most of the questions without much difficult and wishes to be with her mother who was 90 years and does not want to be in assisted living at this time. She stated that she has property in [place], [place] and wants to contact agent and friends to sell the property because she is planning to spend rest of her life in [place], [place]   Interval history: patient is seen with Unk Lightning, LCSW and patient continue to insist that her mother and brother are still alive and she has been in contact with them. Patient has a MCPOA, who reported patient mother and brother were passed away several years ago and she has been drinking daily in her own place. Patient information has no reliability and can not confirm. Patient provided phone number that does not work. Patient endorses that she has been forgetful and can not  give specific information, she is mostly vague. At this time based on my evaluation and examination patient is not safe to be live alone and puts herself in danger due to lack of insight, judgement and significant cognitive deficits.    HPI Elements:  Location:  dementia and depression. Quality:  fair. Severity:  moderate. Timing:  lost orientation from time to time.  Past Psychiatric  History: Past Medical History  Diagnosis Date  . lt breast ca dx'd 2007 or 2008 per pt    chemo and xrt  . Depression   . Bipolar disorder     reports that she has been smoking Cigarettes.  She has been smoking about 0.50 packs per day. She has never used smokeless tobacco. She reports that she drinks about 3.5 ounces of alcohol per week. She reports that she does not use illicit drugs. History reviewed. No pertinent family history.   Living Arrangements: Alone   Abuse/Neglect [facility]) Physical Abuse: Denies Verbal Abuse: Denies Sexual Abuse: Denies Allergies:   Allergies  Allergen Reactions  . Penicillins Other (See Comments)    Childhood allergy    ACT Assessment Complete:  NO Objective: Blood pressure 162/89, pulse 82, temperature 97.8 F (36.6 C), temperature source Oral, resp. rate 18, height $RemoveBe'5\' 6"'QtAjheqOv$  (1.676 m), weight 76.1 kg (167 lb 12.3 oz), SpO2 96.00%.Body mass index is 27.09 kg/(m^2). Results for orders placed during the hospital encounter of 03/26/14 (from the past 72 hour(s))  CBC WITH DIFFERENTIAL     Status: Abnormal   Collection Time    03/26/14  1:41 AM      Result Value Ref Range   WBC 10.5  4.0 - 10.5 K/uL   RBC 4.85  3.87 - 5.11 MIL/uL   Hemoglobin 16.2 (*) 12.0 - 15.0 g/dL   HCT 47.4 (*) 36.0 - 46.0 %   MCV 97.7  78.0 - 100.0 fL   MCH 33.4  26.0 - 34.0 pg   MCHC 34.2  30.0 - 36.0 g/dL   RDW 15.1  11.5 - 15.5 %   Platelets 276  150 - 400 K/uL   Neutrophils Relative % 80 (*) 43 - 77 %   Neutro Abs 8.5 (*) 1.7 - 7.7 K/uL   Lymphocytes Relative 12  12 - 46 %   Lymphs Abs 1.2  0.7 - 4.0 K/uL   Monocytes Relative 8  3 - 12 %   Monocytes Absolute 0.8  0.1 - 1.0 K/uL   Eosinophils Relative 0  0 - 5 %   Eosinophils Absolute 0.0  0.0 - 0.7 K/uL   Basophils Relative 0  0 - 1 %   Basophils Absolute 0.0  0.0 - 0.1 K/uL  ETHANOL     Status: None   Collection Time    03/26/14  1:41 AM      Result Value Ref Range   Alcohol, Ethyl (B) <11  0 - 11 mg/dL    Comment:            LOWEST DETECTABLE LIMIT FOR     SERUM ALCOHOL IS 11 mg/dL     FOR MEDICAL PURPOSES ONLY  I-STAT CHEM 8, ED     Status: Abnormal   Collection Time    03/26/14  1:52 AM      Result Value Ref Range   Sodium 142  137 - 147 mEq/L   Potassium 3.4 (*) 3.7 - 5.3 mEq/L   Chloride 101  96 - 112 mEq/L   BUN 14  6 -  23 mg/dL   Creatinine, Ser 0.80  0.50 - 1.10 mg/dL   Glucose, Bld 121 (*) 70 - 99 mg/dL   Calcium, Ion 1.17  1.13 - 1.30 mmol/L   TCO2 24  0 - 100 mmol/L   Hemoglobin 17.7 (*) 12.0 - 15.0 g/dL   HCT 52.0 (*) 36.0 - 46.0 %  URINALYSIS, ROUTINE W REFLEX MICROSCOPIC     Status: Abnormal   Collection Time    03/26/14  5:42 AM      Result Value Ref Range   Color, Urine YELLOW  YELLOW   APPearance CLEAR  CLEAR   Specific Gravity, Urine 1.013  1.005 - 1.030   pH 7.5  5.0 - 8.0   Glucose, UA NEGATIVE  NEGATIVE mg/dL   Hgb urine dipstick SMALL (*) NEGATIVE   Bilirubin Urine NEGATIVE  NEGATIVE   Ketones, ur NEGATIVE  NEGATIVE mg/dL   Protein, ur NEGATIVE  NEGATIVE mg/dL   Urobilinogen, UA 0.2  0.0 - 1.0 mg/dL   Nitrite NEGATIVE  NEGATIVE   Leukocytes, UA NEGATIVE  NEGATIVE  URINE RAPID DRUG SCREEN (HOSP PERFORMED)     Status: None   Collection Time    03/26/14  5:42 AM      Result Value Ref Range   Opiates NONE DETECTED  NONE DETECTED   Cocaine NONE DETECTED  NONE DETECTED   Benzodiazepines NONE DETECTED  NONE DETECTED   Amphetamines NONE DETECTED  NONE DETECTED   Tetrahydrocannabinol NONE DETECTED  NONE DETECTED   Barbiturates NONE DETECTED  NONE DETECTED   Comment:            DRUG SCREEN FOR MEDICAL PURPOSES     ONLY.  IF CONFIRMATION IS NEEDED     FOR ANY PURPOSE, NOTIFY LAB     WITHIN 5 DAYS.                LOWEST DETECTABLE LIMITS     FOR URINE DRUG SCREEN     Drug Class       Cutoff (ng/mL)     Amphetamine      1000     Barbiturate      200     Benzodiazepine   003     Tricyclics       491     Opiates          300     Cocaine          300      THC              50  URINE MICROSCOPIC-ADD ON     Status: Abnormal   Collection Time    03/26/14  5:42 AM      Result Value Ref Range   Squamous Epithelial / LPF FEW (*) RARE   WBC, UA 0-2  <3 WBC/hpf   RBC / HPF 3-6  <3 RBC/hpf   Bacteria, UA RARE  RARE   Urine-Other AMORPHOUS URATES/PHOSPHATES    AMMONIA     Status: None   Collection Time    03/26/14  7:10 AM      Result Value Ref Range   Ammonia 30  11 - 60 umol/L  VITAMIN B12     Status: None   Collection Time    03/26/14  7:10 AM      Result Value Ref Range   Vitamin B-12 325  211 - 911 pg/mL   Comment: Performed at [place], SERUM  Status: Abnormal   Collection Time    03/26/14  7:10 AM      Result Value Ref Range   Creatinine, Ser 0.64  0.50 - 1.10 mg/dL   GFR calc non Af Amer 85 (*) >90 mL/min   GFR calc Af Amer >90  >90 mL/min   Comment: (NOTE)     The eGFR has been calculated using the CKD EPI equation.     This calculation has not been validated in all clinical situations.     eGFR's persistently <90 mL/min signify possible Chronic Kidney     Disease.  TSH     Status: None   Collection Time    03/26/14  2:11 PM      Result Value Ref Range   TSH 2.520  0.350 - 4.500 uIU/mL   Comment: Performed at [facility]     Status: None   Collection Time    03/26/14  5:47 PM      Result Value Ref Range   Specimen Description URINE, CLEAN CATCH     Special Requests NONE     Culture  Setup Time       Value: 03/26/2014 23:09     Performed at [facility]       Value: 40,000 COLONIES/ML     Performed at [facility]   Culture       Value: Multiple bacterial morphotypes present, none predominant. Suggest appropriate recollection if clinically indicated.     Performed at [facility]   Report Status 03/27/2014 FINAL    CBC     Status: Abnormal   Collection Time    03/27/14  5:00 AM      Result Value Ref Range   WBC 6.5  4.0 -  10.5 K/uL   RBC 4.67  3.87 - 5.11 MIL/uL   Hemoglobin 15.5 (*) 12.0 - 15.0 g/dL   HCT 46.6 (*) 36.0 - 46.0 %   MCV 99.8  78.0 - 100.0 fL   MCH 33.2  26.0 - 34.0 pg   MCHC 33.3  30.0 - 36.0 g/dL   RDW 15.2  11.5 - 15.5 %   Platelets 225  150 - 400 K/uL  COMPREHENSIVE METABOLIC PANEL     Status: Abnormal   Collection Time    03/27/14  5:00 AM      Result Value Ref Range   Sodium 147  137 - 147 mEq/L   Potassium 3.5 (*) 3.7 - 5.3 mEq/L   Chloride 106  96 - 112 mEq/L   CO2 28  19 - 32 mEq/L   Glucose, Bld 112 (*) 70 - 99 mg/dL   BUN 7  6 - 23 mg/dL   Creatinine, Ser 0.67  0.50 - 1.10 mg/dL   Calcium 9.1  8.4 - 10.5 mg/dL   Total Protein 6.2  6.0 - 8.3 g/dL   Albumin 3.3 (*) 3.5 - 5.2 g/dL   AST 14  0 - 37 U/L   ALT 7  0 - 35 U/L   Alkaline Phosphatase 107  39 - 117 U/L   Total Bilirubin 0.5  0.3 - 1.2 mg/dL   GFR calc non Af Amer 83 (*) >90 mL/min   GFR calc Af Amer >90  >90 mL/min   Comment: (NOTE)     The eGFR has been calculated using the CKD EPI equation.     This calculation has not been validated in all clinical  situations.     eGFR's persistently <90 mL/min signify possible Chronic Kidney     Disease.  RPR     Status: None   Collection Time    03/27/14  1:12 PM      Result Value Ref Range   RPR NON REAC  NON REAC   Comment: Performed at [facility] (ROUTINE TESTING)     Status: None   Collection Time    03/27/14  1:12 PM      Result Value Ref Range   HIV 1&2 Ab, 4th Generation NONREACTIVE  NONREACTIVE   Comment: (NOTE)     A NONREACTIVE HIV Ag/Ab result does not exclude HIV infection since     the time frame for seroconversion is variable. If acute HIV infection     is suspected, a HIV-1 RNA Qualitative TMA test is recommended.     HIV-1/2 Antibody Diff         Not indicated.     HIV-1 RNA, Qual TMA           Not indicated.     PLEASE NOTE: This information has been disclosed to you from records     whose confidentiality may be protected  by state law. If your state     requires such protection, then the state law prohibits you from making     any further disclosure of the information without the specific written     consent of the person to whom it pertains, or as otherwise permitted     by law. A general authorization for the release of medical or other     information is NOT sufficient for this purpose.     The performance of this assay has not been clinically validated in     patients less than 15 years old.     Performed at [facility]     Status: None   Collection Time    03/27/14  1:12 PM      Result Value Ref Range   Folate 11.0     Comment: (NOTE)     Reference Ranges            Deficient:       0.4 - 3.3 ng/mL            Indeterminate:   3.4 - 5.4 ng/mL            Normal:              > 5.4 ng/mL     Performed at [facility] are reviewed and are pertinent for WNL.  Current Facility-Administered Medications  Medication Dose Route Frequency Provider Last Rate Last Dose  . acetaminophen (TYLENOL) tablet 650 mg  650 mg Oral Q6H PRN Oswald Hillock, MD       Or  . acetaminophen (TYLENOL) suppository 650 mg  650 mg Rectal Q6H PRN Oswald Hillock, MD      . buPROPion (WELLBUTRIN XL) 24 hr tablet 300 mg  300 mg Oral Daily Durward Parcel, MD   300 mg at 03/28/14 1011  . donepezil (ARICEPT) tablet 10 mg  10 mg Oral QHS Durward Parcel, MD   10 mg at 03/27/14 2112  . enoxaparin (LOVENOX) injection 40 mg  40 mg Subcutaneous Q24H Oswald Hillock, MD   40 mg at 03/28/14 1015  . lamoTRIgine (LAMICTAL) tablet 25 mg  25 mg Oral BID [employer]  Wendee Beavers, MD   25 mg at 03/28/14 1011  . ondansetron (ZOFRAN) tablet 4 mg  4 mg Oral Q6H PRN Oswald Hillock, MD       Or  . ondansetron (ZOFRAN) injection 4 mg  4 mg Intravenous Q6H PRN Oswald Hillock, MD      . tuberculin injection 5 Units  5 Units Intradermal Once Velvet Bathe, MD        Psychiatric Specialty Exam: Physical Exam  ROS  Blood  pressure 162/89, pulse 82, temperature 97.8 F (36.6 C), temperature source Oral, resp. rate 18, height $RemoveBe'5\' 6"'opwrOdQVe$  (1.676 m), weight 76.1 kg (167 lb 12.3 oz), SpO2 96.00%.Body mass index is 27.09 kg/(m^2).  General Appearance: Casual  Eye Contact::  Good  Speech:  Clear and Coherent  Volume:  Normal  Mood:  Anxious and Depressed  Affect:  Constricted and Depressed  Thought Process:  Coherent and Goal Directed  Orientation:  Full (Time, Place, and Person)  Thought Content:  WDL  Suicidal Thoughts:  No  Homicidal Thoughts:  No  Memory:  Immediate;   Good  Judgement:  Intact  Insight:  Fair  Psychomotor Activity:  Decreased  Concentration:  Fair  Recall:  Good  Fund of Knowledge:Good  Language: Good  Akathisia:  NA  Handed:  Right  AIMS (if indicated):     Assets:  Communication Skills Desire for Improvement Financial Resources/Insurance Housing Leisure Time [place] Talents/Skills Transportation  Sleep:      Musculoskeletal: Strength & Muscle Tone: within normal limits Gait & Station: unable to stand Patient leans: N/A  Treatment Plan Summary: Daily contact with patient to assess and evaluate symptoms and progress in treatment Medication management  Jordan Floyd,JANARDHAHA R. 03/28/2014 2:05 PM

## 2014-03-28 NOTE — Progress Notes (Signed)
Clinical Social Work  CSW met with patient and psych MD at bedside. Patient continues to talk about coming to visit brother in [place] and that her family is still alive. Patient cannot remember fine details such as phone numbers, addresses, or why she was traveling. Patient describes a story that she was coming to a hotel because she was looking for her red Gustavus Bryant but story is not consistent and patient is easily distracted. Psych MD reports that he does not feel patient has capacity at this time and states that patient would benefit from ALF.  CSW spoke with HCPOA re: psych MD recommendations and HCPOA reports she is relieved. HCPOA agreeable to ALF placement and has been speaking with Nanine Means in [place] (Phone #: [phone] and Fax #: [phone]). CSW spoke with Dianna and Debbie at ALF who report that a representative has already evaluated patient but they will need to review information. ALF has memory care unit and has spoken with HCPOA as well about patient's needs. CSW faxed information. MD ordered TB test. ALF reports no PASRR needed because she will be private pay. HCPOA agreeable to sign patient in at ALF if accepted. ALF will contact CSW once they have determined if they can accept her.  [place], [place] [phone]

## 2014-03-28 NOTE — Progress Notes (Signed)
Clinical Social Work  ALF has received all information and reports that administrator will review information in the morning and decide if they can accept patient. CSW updated MD on DC plans and will continue to follow.  [place], [place] [phone]

## 2014-03-28 NOTE — Progress Notes (Signed)
Utilization review completed.  

## 2014-03-29 DIAGNOSIS — R4182 Altered mental status, unspecified: Secondary | ICD-10-CM | POA: Diagnosis not present

## 2014-03-29 LAB — VITAMIN B1

## 2014-03-29 NOTE — Progress Notes (Signed)
Clinical Social Work  CSW spoke with [facility] re: DC plans. HCPOA is not interested in SNF placement to assist with ALF placement and reports if that is the last resort to patient DC today then they would take patient home and hire caregivers. HCPOA and CSW spoke with Jackelyn Poling [phone]) who reports they are working on placement at either their [facility] or [facility] facility. CSW continues to follow and working with HCPOA to ensure safe DC today.  [place], [place] [phone]

## 2014-03-29 NOTE — Progress Notes (Signed)
Clinical Social Work  ALF spoke with [facility] and reports no available beds until Monday and it is at a sister facility. HCPOA and patient aware of SNF option but politely declined. HCPOA, CSW, and patient all spoke at bedside. HCPOA to hire 24 hour caregivers and will take car keys away. HCPOA reports she has patient's checkbook and will have the means to pay for caregivers. HCPOA feels if patient has to transition multiple times then she will struggle with adjusting to ALF. HCPOA feels confident that patient will be safe at home and that they can provide adequate care. CSW provided patient and HCPOA with DC packet with DNR and FL2 included. Patient aware of DC plans and agreeable to caregivers and ALF to ensure her own safety.  CSW informed RN and MD of DC plans and all parties agreeable.  [place], [place] [phone]

## 2014-03-29 NOTE — Progress Notes (Signed)
Utilization review completed.  

## 2014-03-29 NOTE — Progress Notes (Signed)
[facility] PROGRESS NOTE  Jordan Floyd YZJ:[id number] DOB: 10-29-1934 DOA: 03/26/2014 PCP: No primary provider on file.  Assessment/Plan:  Active Problems:   Altered mental status - Psychiatry on board : Plan will be to d/c to ALF with memory units - Folate, RPR, HIV: WNL - Vitamin b12, TSH, ammonia within normal limits. - suspect grade of underlying dementia    Depression - Per psychiatry - Patient currently does not have capacity to make her own medical decisions. As such plan will be to prepare a safe discharge plan.  Social worker on board and currently helping [place] with disposition.    History of alcohol abuse per EMR - Ciwa protocol   Code Status: DNR Family Communication: No family at bedside, Jenny Reichmann and Reino Bellis phone [phone], are her healthcare power of attorney Disposition Plan: Pending further work up and recommendations from psychiatrist   Consultants:  Psychiatrist  Procedures:  none   HPI/Subjective: No new complaints. No acute issues reported overnight.  Objective: Filed Vitals:   03/29/14 0602  BP: 143/82  Pulse: 75  Temp: 97.8 F (36.6 C)  Resp: 21    Intake/Output Summary (Last 24 hours) at 03/29/14 1259 Last data filed at 03/28/14 1700  Gross per 24 hour  Intake    240 ml  Output    500 ml  Net   -260 ml   Filed Weights   03/26/14 1000  Weight: 76.1 kg (167 lb 12.3 oz)    Exam:   General:  Pt in NAD, alert and Awake  Cardiovascular: RRR, no MRG  Respiratory: CTA BL, no wheezes  Abdomen: soft, NT, ND  Musculoskeletal: no cyanosis or clubbing   Data Reviewed: Basic Metabolic Panel:  Recent Labs Lab 03/26/14 0152 03/26/14 0710 03/27/14 0500  NA 142  --  147  K 3.4*  --  3.5*  CL 101  --  106  CO2  --   --  28  GLUCOSE 121*  --  112*  BUN 14  --  7  CREATININE 0.80 0.64 0.67  CALCIUM  --   --  9.1   Liver Function Tests:  Recent Labs Lab 03/27/14 0500  AST 14  ALT 7  ALKPHOS 107  BILITOT 0.5   PROT 6.2  ALBUMIN 3.3*   No results found for this basename: LIPASE, AMYLASE,  in the last 168 hours  Recent Labs Lab 03/26/14 0710  AMMONIA 30   CBC:  Recent Labs Lab 03/26/14 0141 03/26/14 0152 03/27/14 0500  WBC 10.5  --  6.5  NEUTROABS 8.5*  --   --   HGB 16.2* 17.7* 15.5*  HCT 47.4* 52.0* 46.6*  MCV 97.7  --  99.8  PLT 276  --  225   Cardiac Enzymes: No results found for this basename: CKTOTAL, CKMB, CKMBINDEX, TROPONINI,  in the last 168 hours BNP (last 3 results) No results found for this basename: PROBNP,  in the last 8760 hours CBG: No results found for this basename: GLUCAP,  in the last 168 hours  Recent Results (from the past 240 hour(s))  URINE CULTURE     Status: None   Collection Time    03/26/14  5:47 PM      Result Value Ref Range Status   Specimen Description URINE, CLEAN CATCH   Final   Special Requests NONE   Final   Culture  Setup Time     Final   Value: 03/26/2014 23:09     Performed at [facility]  Colony Count     Final   Value: 40,000 COLONIES/ML     Performed at [facility]   Culture     Final   Value: Multiple bacterial morphotypes present, none predominant. Suggest appropriate recollection if clinically indicated.     Performed at [facility]   Report Status 03/27/2014 FINAL   Final     Studies: No results found.  Scheduled Meds: . buPROPion  300 mg Oral Daily  . donepezil  10 mg Oral QHS  . enoxaparin (LOVENOX) injection  40 mg Subcutaneous Q24H  . lamoTRIgine  25 mg Oral BID  . tuberculin  5 Units Intradermal Once   Continuous Infusions:    Time spent: > 35 minutes    Velvet Bathe  [facility] Pager [phone]. If 7PM-7AM, please contact night-coverage at [email or web address], password [facility] 03/29/2014, 12:59 PM  LOS: 3 days

## 2014-03-29 NOTE — Discharge Summary (Signed)
Physician Discharge Summary  Jordan Floyd [id number] DOB: [birth year] DOA: 03/26/2014  PCP: No primary provider on file.  Admit date: 03/26/2014 Discharge date: 03/29/2014  Time spent: > 35 minutes  Recommendations for Outpatient Follow-up:  1. Consider reassessing K levels within the next 1 week  Discharge Diagnoses:  Active Problems:   Altered mental status   Depression   Discharge Condition: stable  Diet recommendation: regular diet  Filed Weights   03/26/14 1000  Weight: 76.1 kg (167 lb 12.3 oz)    History of present illness:  79 year old female who has a past medical history of lt breast ca (dx'd 2007 or 2008 per pt)., depression, ? Early dementia who was found to the parking lot of [place], as she was confused social brought to the hospital.  Hospital Course:   Active Problems:  Altered mental status  - Folate, RPR, HIV: WNL  - Vitamin b12, TSH, ammonia within normal limits.  - Psychiatry consulted while patient was inhouse and recommended the following: Plan:  Patient has no capacity to make her own medical decisions based on evaluation today  Case discussed with Dr. Wendee Beavers and Sindy Messing, LCSW  Continue Wellbutrin XL 300 mg Qam for depression  Continue Aricept 10 mg PO Qhs  Appreciate dementia work up and recommend SNF with memory unit to prevent wondering and safety  No evidence of imminent risk to self or others at present.  Patient does not meet criteria for psychiatric inpatient admission.  Supportive therapy provided about ongoing stressors.  Discussed crisis plan, support from social network, calling 911, coming to the Emergency Department, and calling Suicide Hotline.  Appreciate psychiatric consultation  Please contact [phone] if needs further assistance - Currently awaiting placement into ALF facility  Depression  - Per psychiatry  - Patient currently does not have capacity to make her own medical decisions. As such plan will be to  prepare a safe discharge plan. Social worker on board and currently helping [place] with disposition. Currently considering ALF with a memory unit.  History of alcohol abuse per EMR  - Ciwa protocol   Procedures:  None  Consultations:  Psychiatry  Discharge Exam: Filed Vitals:   03/29/14 0602  BP: 143/82  Pulse: 75  Temp: 97.8 F (36.6 C)  Resp: 21    General: Pt in NAD, alert and awake Cardiovascular: RRR, no MRG Respiratory: CTA BL, no wheezes  Discharge Instructions You were cared for by a hospitalist during your hospital stay. If you have any questions about your discharge medications or the care you received while you were in the hospital after you are discharged, you can call the unit and asked to speak with the hospitalist on call if the hospitalist that took care of you is not available. Once you are discharged, your primary care physician will handle any further medical issues. Please note that NO REFILLS for any discharge medications will be authorized once you are discharged, as it is imperative that you return to your primary care physician (or establish a relationship with a primary care physician if you do not have one) for your aftercare needs so that they can reassess your need for medications and monitor your lab values.  Discharge Instructions   Call MD for:  extreme fatigue    Complete by:  As directed      Call MD for:  temperature >100.4    Complete by:  As directed      Diet - low sodium heart healthy  Complete by:  As directed      Increase activity slowly    Complete by:  As directed             Medication List    STOP taking these medications       venlafaxine XR 150 MG 24 hr capsule  Commonly known as:  EFFEXOR-XR      TAKE these medications       buPROPion 150 MG 24 hr tablet  Commonly known as:  WELLBUTRIN XL  Take 300 mg by mouth every morning.     donepezil 5 MG tablet  Commonly known as:  ARICEPT  Take 5 mg by mouth at bedtime.      lamoTRIgine 25 MG tablet  Commonly known as:  LAMICTAL  Take 25 mg by mouth 2 (two) times daily.     simvastatin 40 MG tablet  Commonly known as:  ZOCOR  Take 40 mg by mouth every evening.       Allergies  Allergen Reactions  . Penicillins Other (See Comments)    Childhood allergy      The results of significant diagnostics from this hospitalization (including imaging, microbiology, ancillary and laboratory) are listed below for reference.    Significant Diagnostic Studies: Dg Chest 2 View  03/26/2014   CLINICAL DATA:  Altered mental status.  History of breast cancer.  EXAM: CHEST  2 VIEW  COMPARISON:  None.  FINDINGS: Shallow inspiration. Heart size and pulmonary vascularity are normal for technique. No focal airspace disease in the lungs. No blunting of costophrenic angles. Degenerative changes in the spine. Surgical clips in the left axilla. Old fracture deformity of distal left clavicle.  IMPRESSION: Shallow inspiration.  No evidence of active pulmonary disease.   Electronically Signed   By: Lucienne Capers M.D.   On: 03/26/2014 05:06   Ct Head Wo Contrast  03/26/2014   CLINICAL DATA:  Altered mental status, memory difficulties. History of breast cancer.  EXAM: CT HEAD WITHOUT CONTRAST  TECHNIQUE: Contiguous axial images were obtained from the base of the skull through the vertex without intravenous contrast.  COMPARISON:  MRI of the brain October 25, 2013  FINDINGS: Moderate to severe ventriculomegaly, likely on the basis of global parenchymal brain volume loss as there is commensurate enlargement of cerebral sulci and cerebellar folia, which appears somewhat advanced from prior imaging. Bifrontal diameter was 4.5 cm, canal 4.7 cm.  No intraparenchymal hemorrhage, mass effect nor midline shift. Patchy supratentorial white matter hypodensities are within normal range for patient's age and though non-specific suggest sequelae of chronic small vessel ischemic disease. No acute large  vascular territory infarcts. Remote right basal ganglia lacunar infarct.  No abnormal extra-axial fluid collections. Basal cisterns are patent. Mild calcific atherosclerosis of the carotid siphons.  No skull fracture. Osteopenia. The included ocular globes and orbital contents are non-suspicious. The mastoid aircells and included paranasal sinuses are well-aerated. Thickened maxillary sinus walls compatible with chronic sinusitis without acute component.  IMPRESSION: Moderate to severe apparent global parenchymal brain volume loss, considering progression from prior MRI could reflect a component of treatment related changes are dehydration, recommend clinical correlation.  Moderate white matter changes suggest chronic small vessel ischemic disease. Remote right basal ganglia lacunar infarct.  Osteopenia.   Electronically Signed   By: Elon Alas   On: 03/26/2014 02:14    Microbiology: Recent Results (from the past 240 hour(s))  URINE CULTURE     Status: None   Collection Time  03/26/14  5:47 PM      Result Value Ref Range Status   Specimen Description URINE, CLEAN CATCH   Final   Special Requests NONE   Final   Culture  Setup Time     Final   Value: 03/26/2014 23:09     Performed at [facility]     Final   Value: 40,000 COLONIES/ML     Performed at [facility]   Culture     Final   Value: Multiple bacterial morphotypes present, none predominant. Suggest appropriate recollection if clinically indicated.     Performed at [facility]   Report Status 03/27/2014 FINAL   Final     Labs: Basic Metabolic Panel:  Recent Labs Lab 03/26/14 0152 03/26/14 0710 03/27/14 0500  NA 142  --  147  K 3.4*  --  3.5*  CL 101  --  106  CO2  --   --  28  GLUCOSE 121*  --  112*  BUN 14  --  7  CREATININE 0.80 0.64 0.67  CALCIUM  --   --  9.1   Liver Function Tests:  Recent Labs Lab 03/27/14 0500  AST 14  ALT 7  ALKPHOS 107  BILITOT 0.5  PROT  6.2  ALBUMIN 3.3*   No results found for this basename: LIPASE, AMYLASE,  in the last 168 hours  Recent Labs Lab 03/26/14 0710  AMMONIA 30   CBC:  Recent Labs Lab 03/26/14 0141 03/26/14 0152 03/27/14 0500  WBC 10.5  --  6.5  NEUTROABS 8.5*  --   --   HGB 16.2* 17.7* 15.5*  HCT 47.4* 52.0* 46.6*  MCV 97.7  --  99.8  PLT 276  --  225   Cardiac Enzymes: No results found for this basename: CKTOTAL, CKMB, CKMBINDEX, TROPONINI,  in the last 168 hours BNP: BNP (last 3 results) No results found for this basename: PROBNP,  in the last 8760 hours CBG: No results found for this basename: GLUCAP,  in the last 168 hours     Signed:  Velvet Bathe  [facility] 03/29/2014, 2:41 PM

## 2014-03-29 NOTE — Progress Notes (Signed)
Clinical Social Work  CSW met with patient at bedside with HCPOA, Cyr and Jenny Reichmann, present. Patient introduced myself to [facility] and explained role. CSW and HCPOA had long conversation with patient re: need for ALF at DC. Patient hesitant and reports she does not feel she is ready for ALF but understood concerns about her safety. Patient and HCPOA aware that CSW is working with [facility] for placement. CSW called and left another message with Debbie at ALF re: if they can accept patient. CSW will continue to follow.  [place], [place] [phone]

## 2014-03-29 NOTE — Progress Notes (Signed)
Clinical Social Work  CSW staffed case with Surveyor, quantity (Zack Willis) and Mudlogger [facility] Rife) re: DC barriers. CSW advised that patient is medically stable and needs to DC today. SW Department reports they could provide a 1 week letter of guarantee (LOG) at SNF until transition to ALF could occur. CSW called ALF and spoke with Dianna who reports she is discussing case with RN currently. AD suggested [facility] (SNF) on LOG if ALF is unable to accept today. CSW submitted for [facility] and will continue to follow to assist with DC planning. CSW spoke with HCPOA and explained plans and will keep her updated as well.  [place], [place] [phone]

## 2014-03-29 NOTE — Progress Notes (Signed)
Clinical Social Work  CSW called ALF and left a message with admissions (Debbie) in order to determine if they are able to accept patient. CSW will continue to follow.  [place], [place] [phone]

## 2015-03-18 IMAGING — CT CT HEAD W/O CM
1 series · 15 of 30 positions shown, 19 images · non-contrast
Comparison: MRI of the brain October 25, 2013

CLINICAL DATA: Altered mental status, memory difficulties. History
of breast cancer.

EXAM:
CT HEAD WITHOUT CONTRAST
TECHNIQUE: Contiguous axial images were obtained from the base of the skull
through the vertex without intravenous contrast.

[Series 2: headseq 4.8 h45s · axial · 0.43mm/px · z∈[-112,+40]mm · 15 of 36 slices shown, 19 images]
[im 2/36  brain]
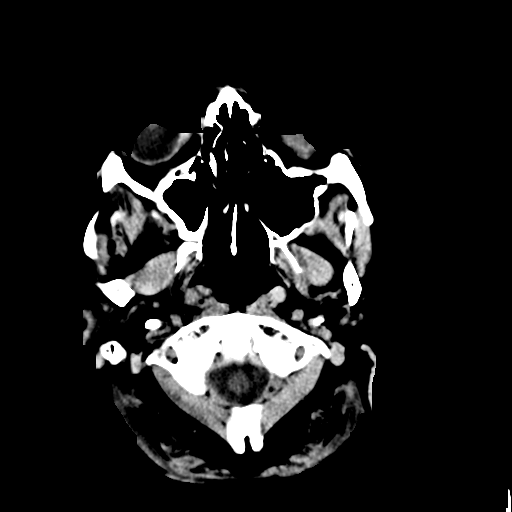
[im 2/36  bone]
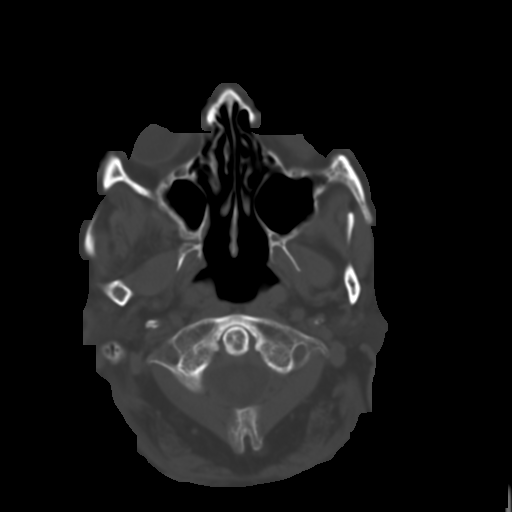
[im 4/36  brain]
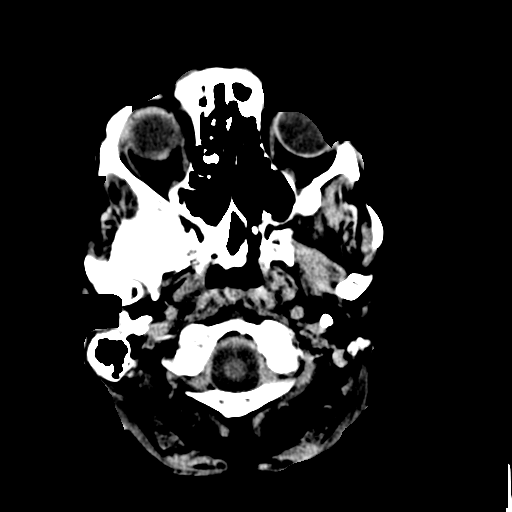
[im 7/36  brain]
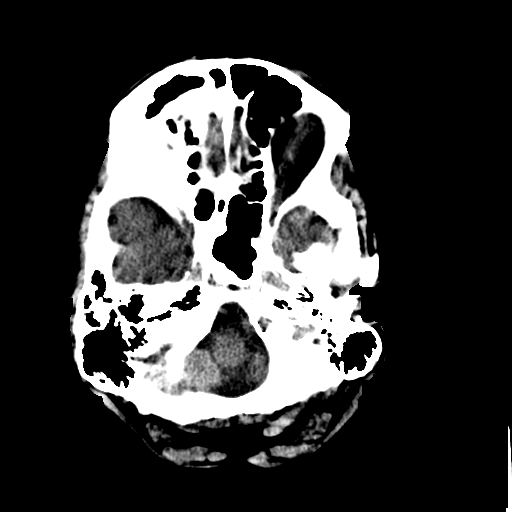
[im 9/36  brain]
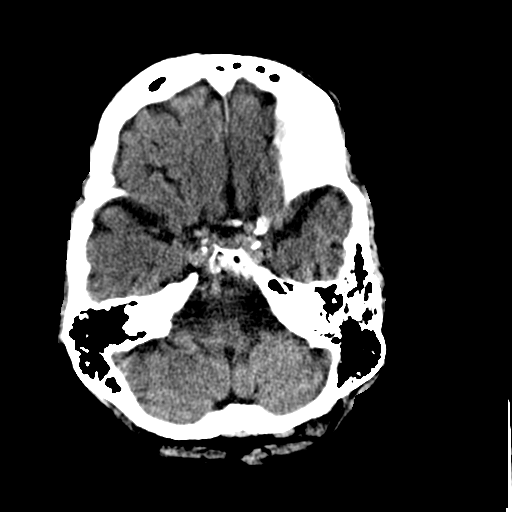
[im 11/36  brain]
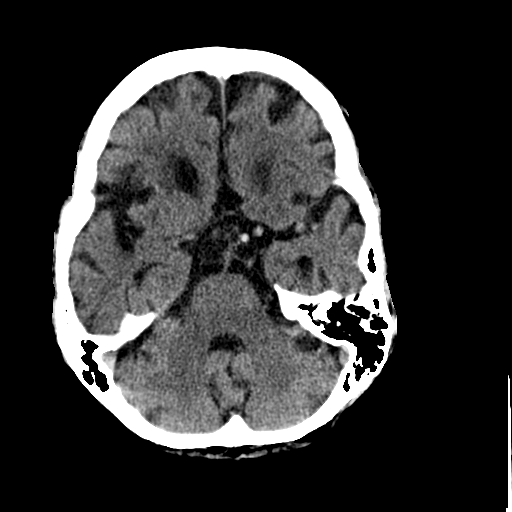
[im 11/36  bone]
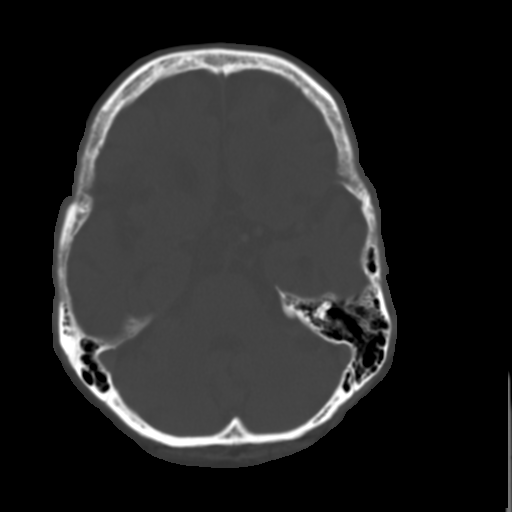
[im 14/36  brain]
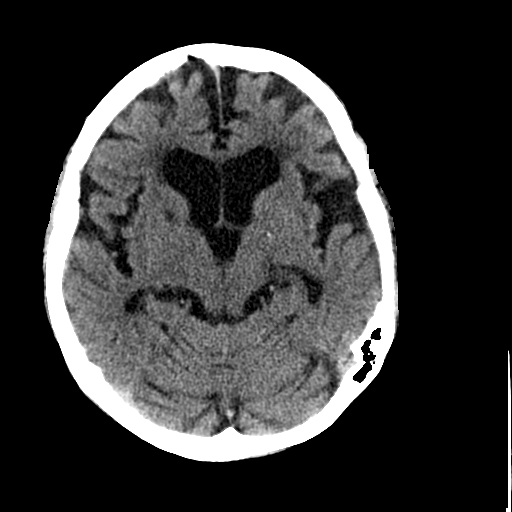
[im 16/36  brain]
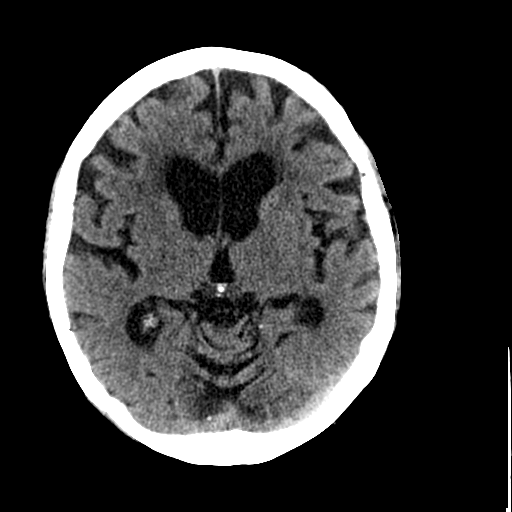
[im 19/36  brain]
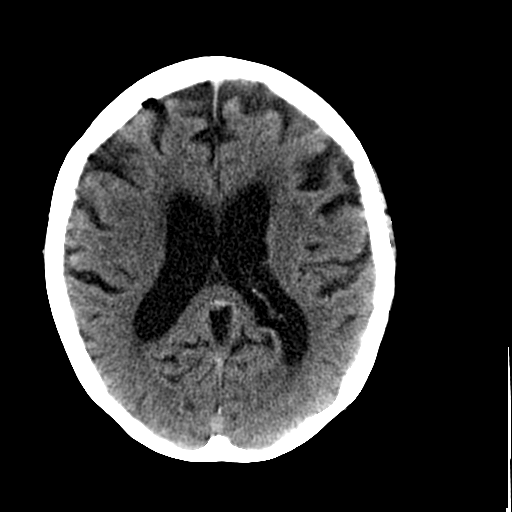
[im 20/36  brain]
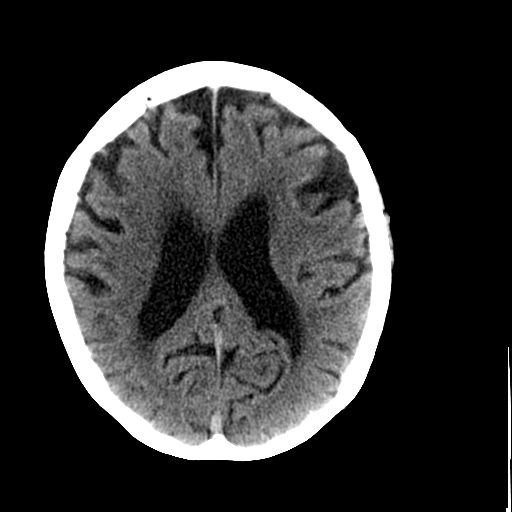
[im 20/36  bone]
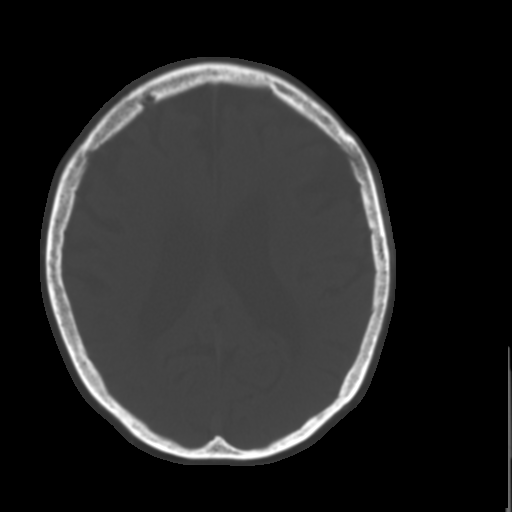
[im 22/36  brain]
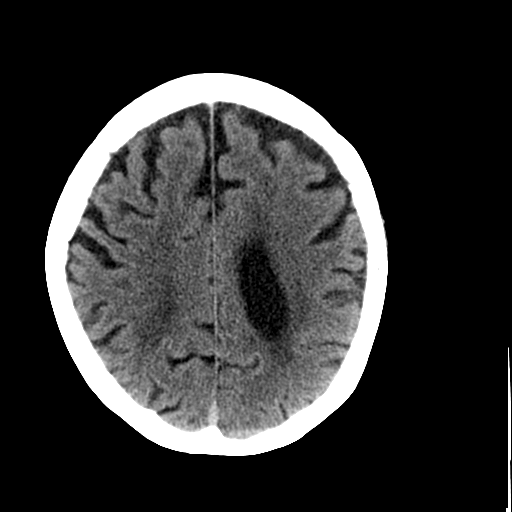
[im 25/36  brain]
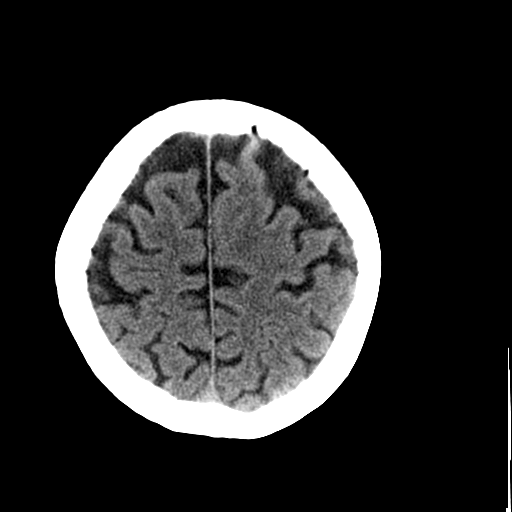
[im 27/36  brain]
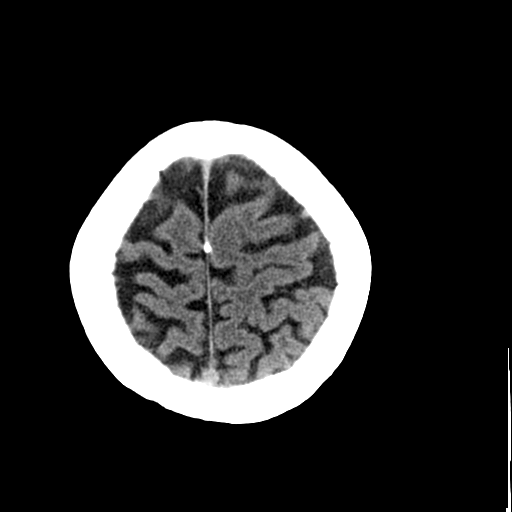
[im 29/36  brain]
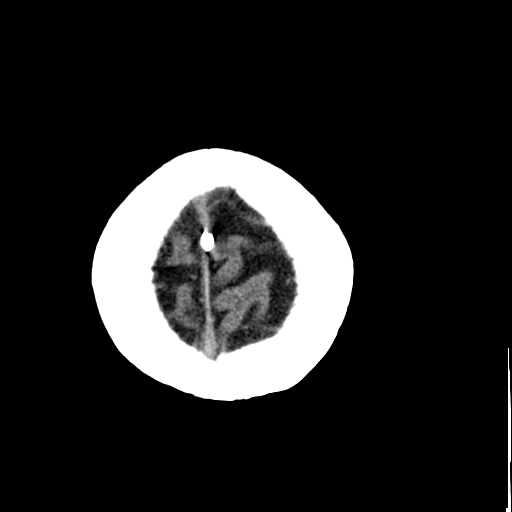
[im 29/36  bone]
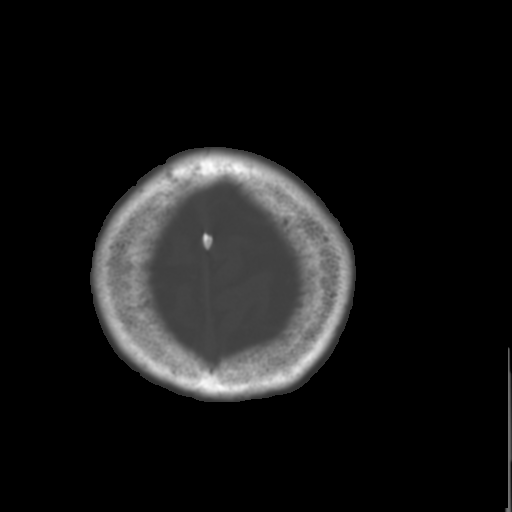
[im 32/36  brain]
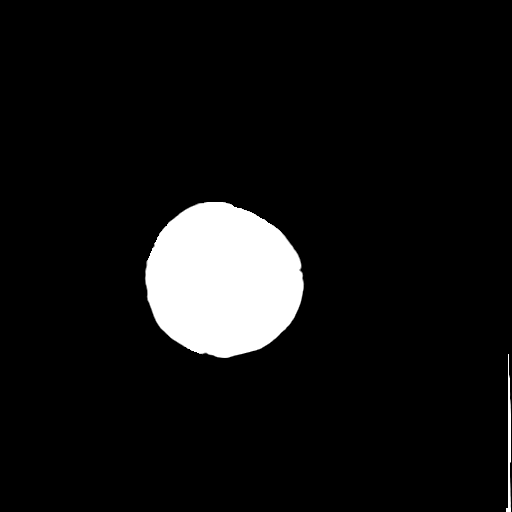
[im 34/36  brain]
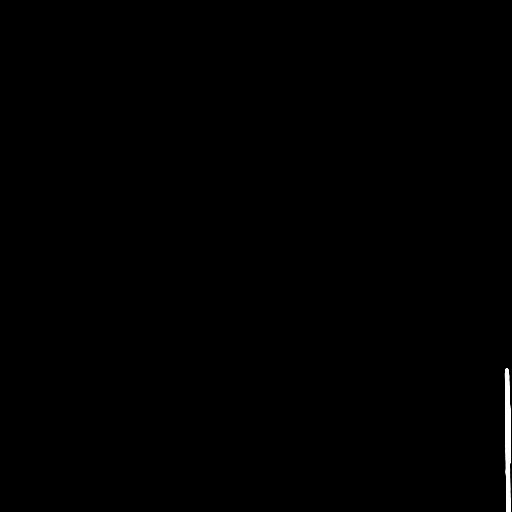

[15 of 30 positions shown; findings below may reference images not displayed]

FINDINGS: Moderate to severe ventriculomegaly, likely on the basis of global
parenchymal brain volume loss as there is commensurate enlargement
of cerebral sulci and cerebellar folia, which appears somewhat
advanced from prior imaging. Bifrontal diameter was 4.5 cm, canal
4.7 cm.

No intraparenchymal hemorrhage, mass effect nor midline shift.
Patchy supratentorial white matter hypodensities are within normal
range for patient's age and though non-specific suggest sequelae of
chronic small vessel ischemic disease. No acute large vascular
territory infarcts. Remote right basal ganglia lacunar infarct.

No abnormal extra-axial fluid collections. Basal cisterns are
patent. Mild calcific atherosclerosis of the carotid siphons.

No skull fracture. Osteopenia. The included ocular globes and
orbital contents are non-suspicious. The mastoid aircells and
included paranasal sinuses are well-aerated. Thickened maxillary
sinus walls compatible with chronic sinusitis without acute
component.
IMPRESSION: Moderate to severe apparent global parenchymal brain volume loss,
considering progression from prior MRI could reflect a component of
treatment related changes are dehydration, recommend clinical
correlation.

Moderate white matter changes suggest chronic small vessel ischemic
disease. Remote right basal ganglia lacunar infarct.

Osteopenia.

  By: Laaouina Tiger

## 2015-03-18 IMAGING — CR DG CHEST 2V
2 series · 2 of 2 positions shown · non-contrast
Comparison: None.

CLINICAL DATA: Altered mental status.  History of breast cancer.

EXAM:
CHEST  2 VIEW

[w chest lat]
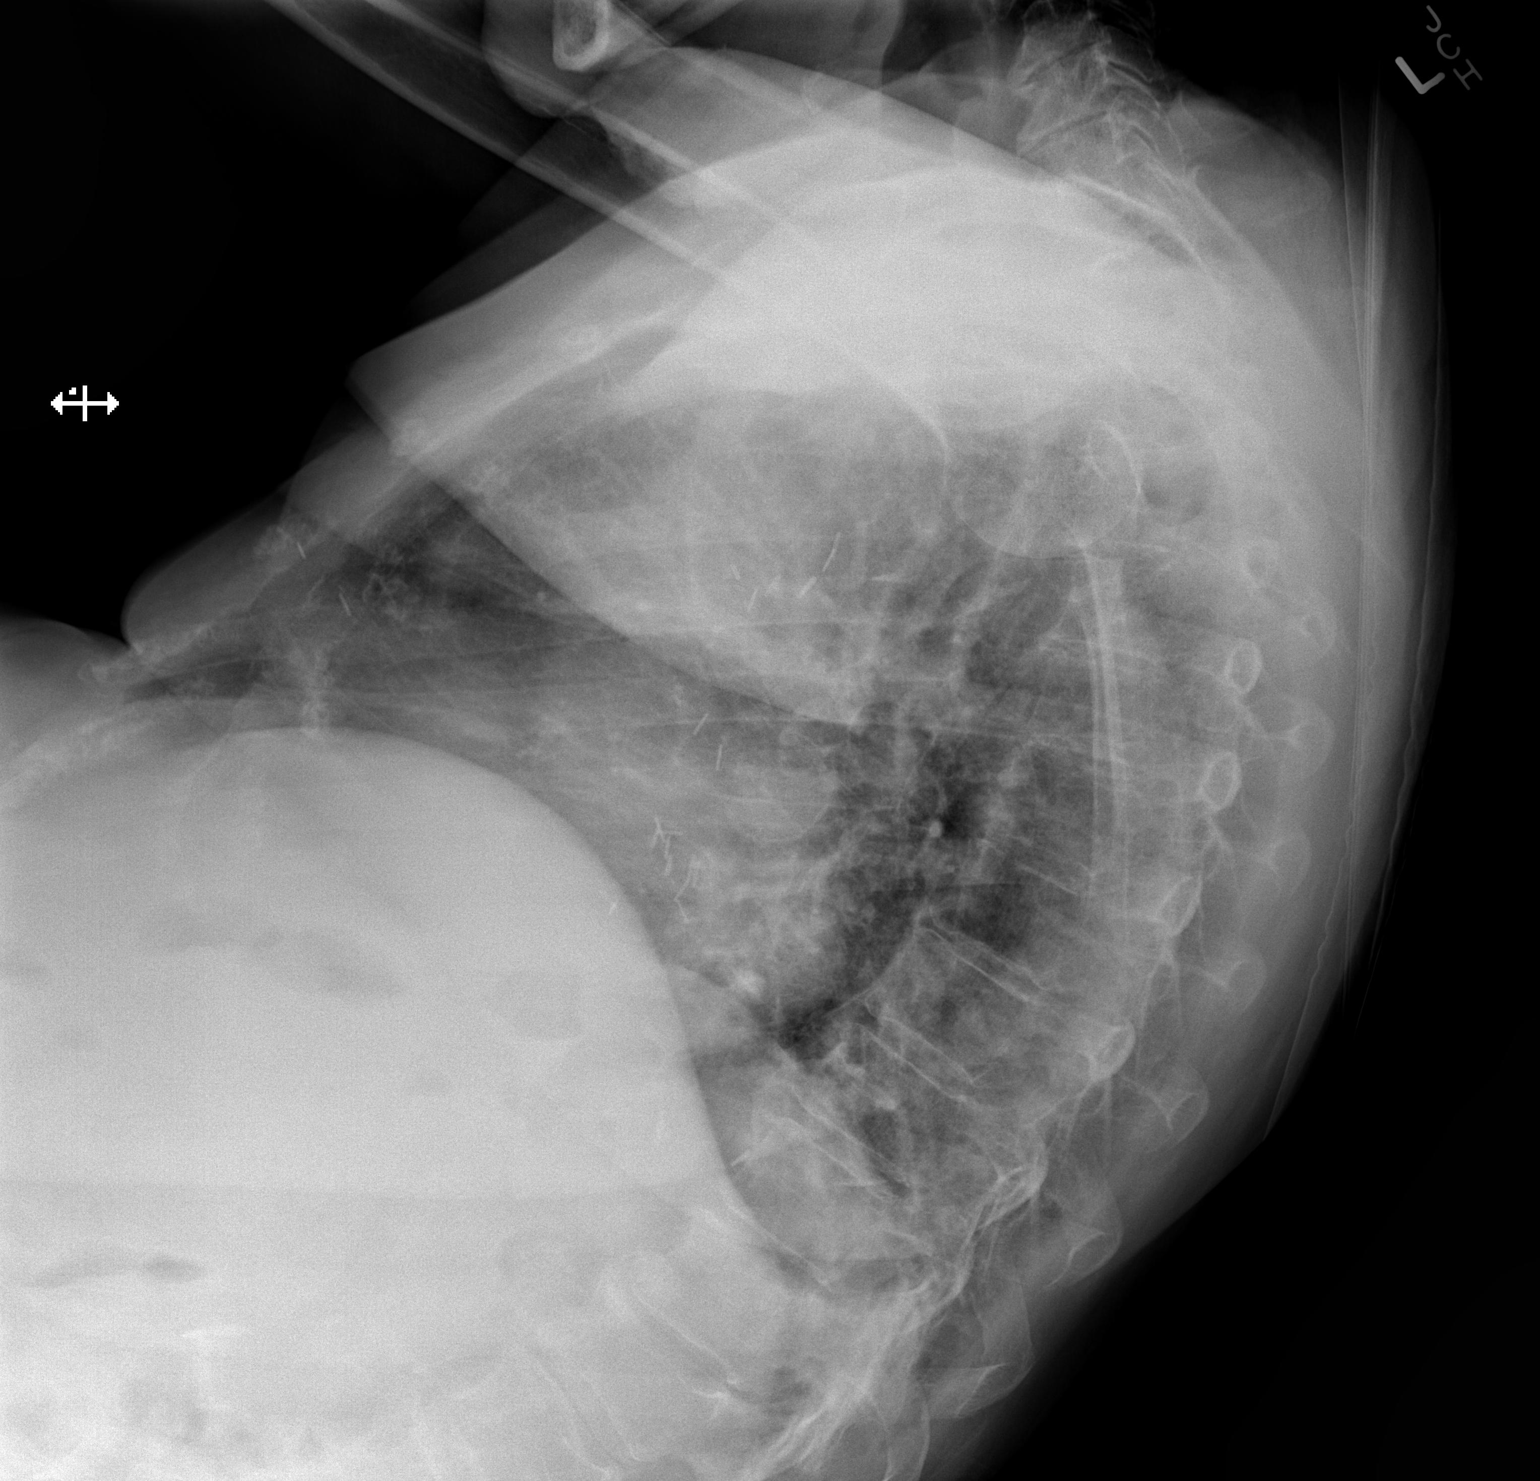

[x chest ap]
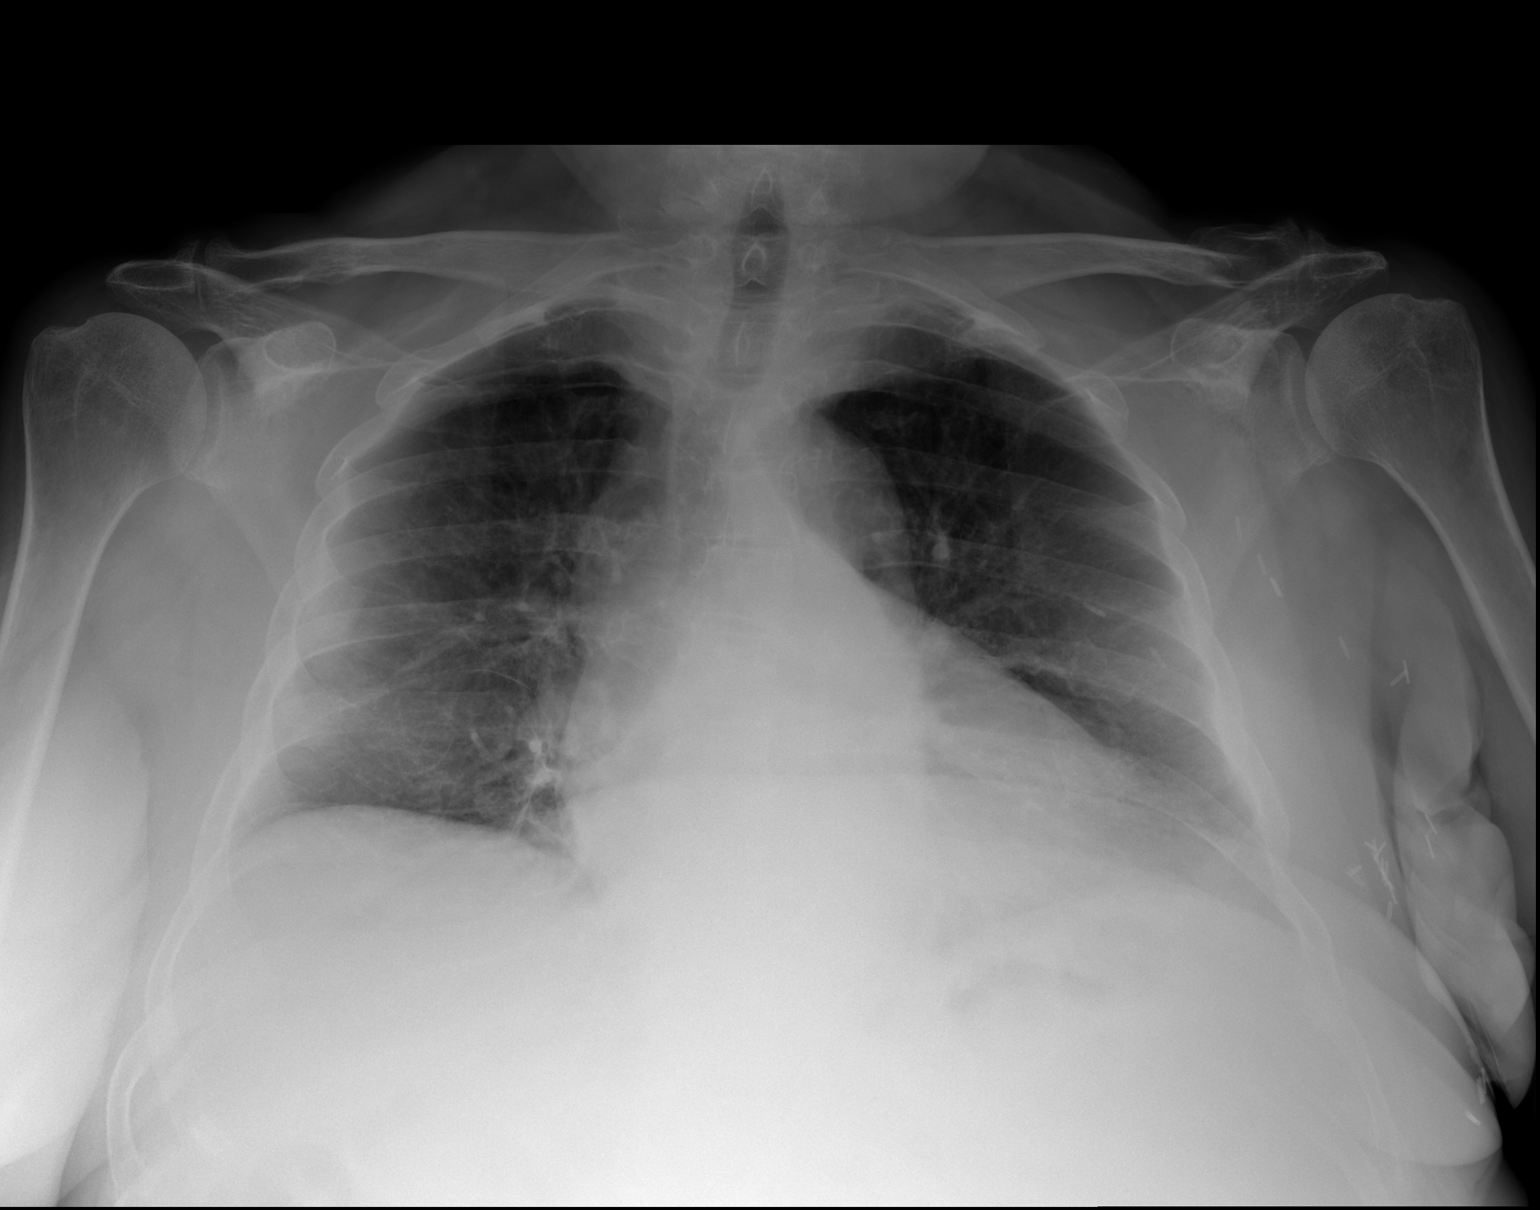

[2 of 2 positions shown; findings below may reference images not displayed]

FINDINGS: Shallow inspiration. Heart size and pulmonary vascularity are normal
for technique. No focal airspace disease in the lungs. No blunting
of costophrenic angles. Degenerative changes in the spine. Surgical
clips in the left axilla. Old fracture deformity of distal left
clavicle.
IMPRESSION: Shallow inspiration.  No evidence of active pulmonary disease.

## 2016-05-04 DEATH — deceased
